# Patient Record
Sex: Male | Born: 1945 | Race: White | Hispanic: No | Smoking: Former smoker
Health system: Southern US, Community
[De-identification: ages and names within clinical notes are randomized; demographics above are authoritative.]

## PROBLEM LIST (undated history)

## (undated) DIAGNOSIS — Z8719 Personal history of other diseases of the digestive system: Secondary | ICD-10-CM

## (undated) DIAGNOSIS — M1990 Unspecified osteoarthritis, unspecified site: Secondary | ICD-10-CM

## (undated) DIAGNOSIS — I2510 Atherosclerotic heart disease of native coronary artery without angina pectoris: Secondary | ICD-10-CM

## (undated) DIAGNOSIS — R112 Nausea with vomiting, unspecified: Secondary | ICD-10-CM

## (undated) DIAGNOSIS — Z8711 Personal history of peptic ulcer disease: Secondary | ICD-10-CM

## (undated) DIAGNOSIS — K219 Gastro-esophageal reflux disease without esophagitis: Secondary | ICD-10-CM

## (undated) DIAGNOSIS — C801 Malignant (primary) neoplasm, unspecified: Secondary | ICD-10-CM

## (undated) DIAGNOSIS — I219 Acute myocardial infarction, unspecified: Secondary | ICD-10-CM

## (undated) DIAGNOSIS — I10 Essential (primary) hypertension: Secondary | ICD-10-CM

## (undated) DIAGNOSIS — C61 Malignant neoplasm of prostate: Secondary | ICD-10-CM

## (undated) DIAGNOSIS — G629 Polyneuropathy, unspecified: Secondary | ICD-10-CM

## (undated) DIAGNOSIS — Z9889 Other specified postprocedural states: Secondary | ICD-10-CM

## (undated) DIAGNOSIS — J189 Pneumonia, unspecified organism: Secondary | ICD-10-CM

## (undated) DIAGNOSIS — E7800 Pure hypercholesterolemia, unspecified: Secondary | ICD-10-CM

## (undated) DIAGNOSIS — R351 Nocturia: Secondary | ICD-10-CM

## (undated) HISTORY — PX: APPENDECTOMY: SHX54

## (undated) HISTORY — PX: KNEE ARTHROSCOPY: SUR90

## (undated) HISTORY — PX: EYE SURGERY: SHX253

---

## 2002-04-24 ENCOUNTER — Encounter (INDEPENDENT_AMBULATORY_CARE_PROVIDER_SITE_OTHER): Payer: Self-pay | Admitting: Specialist

## 2002-04-24 ENCOUNTER — Ambulatory Visit (HOSPITAL_COMMUNITY): Admission: RE | Admit: 2002-04-24 | Discharge: 2002-04-24 | Payer: Self-pay | Admitting: Gastroenterology

## 2010-12-10 ENCOUNTER — Inpatient Hospital Stay (HOSPITAL_COMMUNITY)
Admission: RE | Admit: 2010-12-10 | Discharge: 2010-12-20 | Payer: Self-pay | Source: Home / Self Care | Attending: Cardiothoracic Surgery | Admitting: Cardiothoracic Surgery

## 2010-12-10 DIAGNOSIS — I219 Acute myocardial infarction, unspecified: Secondary | ICD-10-CM

## 2010-12-10 HISTORY — DX: Acute myocardial infarction, unspecified: I21.9

## 2010-12-11 ENCOUNTER — Encounter: Payer: Self-pay | Admitting: Cardiothoracic Surgery

## 2010-12-13 HISTORY — PX: CORONARY ARTERY BYPASS GRAFT: SHX141

## 2010-12-23 ENCOUNTER — Emergency Department (HOSPITAL_COMMUNITY)
Admission: EM | Admit: 2010-12-23 | Discharge: 2010-12-23 | Payer: Self-pay | Source: Home / Self Care | Admitting: Emergency Medicine

## 2011-01-10 ENCOUNTER — Encounter
Admission: RE | Admit: 2011-01-10 | Discharge: 2011-01-10 | Payer: Self-pay | Source: Home / Self Care | Attending: Cardiothoracic Surgery | Admitting: Cardiothoracic Surgery

## 2011-01-17 ENCOUNTER — Ambulatory Visit
Admission: RE | Admit: 2011-01-17 | Discharge: 2011-01-17 | Payer: Self-pay | Source: Home / Self Care | Attending: Cardiothoracic Surgery | Admitting: Cardiothoracic Surgery

## 2011-01-31 ENCOUNTER — Ambulatory Visit: Payer: Medicare Other | Admitting: Cardiothoracic Surgery

## 2011-01-31 DIAGNOSIS — I2510 Atherosclerotic heart disease of native coronary artery without angina pectoris: Secondary | ICD-10-CM

## 2011-02-05 ENCOUNTER — Ambulatory Visit (HOSPITAL_COMMUNITY): Payer: No Typology Code available for payment source

## 2011-02-07 ENCOUNTER — Ambulatory Visit (HOSPITAL_COMMUNITY): Payer: No Typology Code available for payment source

## 2011-02-09 ENCOUNTER — Ambulatory Visit (HOSPITAL_COMMUNITY): Payer: No Typology Code available for payment source

## 2011-02-12 ENCOUNTER — Ambulatory Visit (HOSPITAL_COMMUNITY): Payer: No Typology Code available for payment source

## 2011-02-12 NOTE — Assessment & Plan Note (Signed)
OFFICE VISIT  [name], [name] DOB:  [birth year]                                        January 31, 2011 CHART #:  [id number]  CURRENT PROBLEMS: 1. Status post CABG x4 December 15 for severe multivessel disease with     STEMI. 2. Postoperative atrial fibrillation, converted to a sinus rhythm.  PRESENT ILLNESS:  [name] is a very nice 66 year old ex-smoker who presents for his second postop office visit after multivessel bypass grafting for unstable angina, STEMI and three-vessel disease.  I saw him 2 weeks ago, at which time, he is having considerable nausea, weakness and depression, which was inhibiting his recovery and rehabilitation. At that time, his amiodarone was stopped for his postoperative AFib and we also stopped Crestor, and he was provided with prescription for p.r.n. Zofran.  Subsequent to that visit, he was seen by a neurologist, Dr. Patsi Sears for some hematuria, and was found to have an apparent urethral abrasion from the catheter insertion at surgery and a CT scan was clean of any polyps, neoplasm or stone.  The hematuria has stopped. His nausea is improved.  He is eating much better, but he is not yet gained any weight back.  He is scheduled to start outpatient cardiac rehab next week.  He denies angina and the surgical incisions are all healing well.  CURRENT MEDICATIONS:  Synthroid, lisinopril 2.5, metformin 500 b.i.d., Lopressor 12.5 b.i.d., aspirin 81 daily, albuterol and Advil p.r.n., and nasal spray.  PHYSICAL EXAMINATION:  Vital Signs:  Blood pressure 100/70, pulse 65, respirations 20, saturation 95%.  He appears to be feeling much better at this time.  Breath sounds are clear and his cardiac rhythm is regular without gallop or murmur.  The surgical incisions well healed.  IMPRESSION AND PLAN:  The patient appears to be now back on track and he will stay off the amiodarone and Crestor and hopefully with improved appetite  and resolution of his nausea, his weight gain well resume.  I again reminded him not to lift anything more than 20 pounds until 3 months after surgery but to continue with the recommendations of his cardiac rehab program at the hospital, and that I would be happy to see him back p.r.n.  Kerin Perna, M.D. Electronically Signed  PV/MEDQ  D:  01/31/2011  T:  02/01/2011  Job:  [id number]  cc:   Georga Hacking, M.D.

## 2011-02-14 ENCOUNTER — Ambulatory Visit (HOSPITAL_COMMUNITY): Payer: No Typology Code available for payment source

## 2011-02-16 ENCOUNTER — Ambulatory Visit (HOSPITAL_COMMUNITY): Payer: No Typology Code available for payment source

## 2011-02-19 ENCOUNTER — Ambulatory Visit (HOSPITAL_COMMUNITY): Payer: No Typology Code available for payment source

## 2011-02-21 ENCOUNTER — Ambulatory Visit (HOSPITAL_COMMUNITY): Payer: No Typology Code available for payment source

## 2011-02-23 ENCOUNTER — Ambulatory Visit (HOSPITAL_COMMUNITY): Payer: No Typology Code available for payment source

## 2011-02-26 ENCOUNTER — Ambulatory Visit (HOSPITAL_COMMUNITY): Payer: No Typology Code available for payment source

## 2011-02-28 ENCOUNTER — Ambulatory Visit (HOSPITAL_COMMUNITY): Payer: No Typology Code available for payment source

## 2011-03-02 ENCOUNTER — Ambulatory Visit (HOSPITAL_COMMUNITY): Payer: No Typology Code available for payment source

## 2011-03-05 ENCOUNTER — Ambulatory Visit (HOSPITAL_COMMUNITY): Payer: No Typology Code available for payment source

## 2011-03-07 ENCOUNTER — Ambulatory Visit (HOSPITAL_COMMUNITY): Payer: No Typology Code available for payment source

## 2011-03-09 ENCOUNTER — Ambulatory Visit (HOSPITAL_COMMUNITY): Payer: No Typology Code available for payment source

## 2011-03-12 ENCOUNTER — Ambulatory Visit (HOSPITAL_COMMUNITY): Payer: No Typology Code available for payment source

## 2011-03-12 LAB — APTT: aPTT: 33 seconds (ref 24–37)

## 2011-03-12 LAB — CBC
HCT: 26.5 % — ABNORMAL LOW (ref 39.0–52.0)
HCT: 28.4 % — ABNORMAL LOW (ref 39.0–52.0)
HCT: 28.5 % — ABNORMAL LOW (ref 39.0–52.0)
HCT: 29.8 % — ABNORMAL LOW (ref 39.0–52.0)
HCT: 31 % — ABNORMAL LOW (ref 39.0–52.0)
HCT: 31.6 % — ABNORMAL LOW (ref 39.0–52.0)
HCT: 32.7 % — ABNORMAL LOW (ref 39.0–52.0)
HCT: 33.8 % — ABNORMAL LOW (ref 39.0–52.0)
Hemoglobin: 10 g/dL — ABNORMAL LOW (ref 13.0–17.0)
Hemoglobin: 10.2 g/dL — ABNORMAL LOW (ref 13.0–17.0)
Hemoglobin: 10.6 g/dL — ABNORMAL LOW (ref 13.0–17.0)
Hemoglobin: 11 g/dL — ABNORMAL LOW (ref 13.0–17.0)
Hemoglobin: 11.4 g/dL — ABNORMAL LOW (ref 13.0–17.0)
Hemoglobin: 11.9 g/dL — ABNORMAL LOW (ref 13.0–17.0)
Hemoglobin: 9.2 g/dL — ABNORMAL LOW (ref 13.0–17.0)
MCH: 31.4 pg (ref 26.0–34.0)
MCH: 31.5 pg (ref 26.0–34.0)
MCH: 31.6 pg (ref 26.0–34.0)
MCH: 31.6 pg (ref 26.0–34.0)
MCH: 31.7 pg (ref 26.0–34.0)
MCH: 31.8 pg (ref 26.0–34.0)
MCH: 31.9 pg (ref 26.0–34.0)
MCH: 32.1 pg (ref 26.0–34.0)
MCH: 32.6 pg (ref 26.0–34.0)
MCHC: 34.7 g/dL (ref 30.0–36.0)
MCHC: 34.7 g/dL (ref 30.0–36.0)
MCHC: 35.2 g/dL (ref 30.0–36.0)
MCHC: 35.5 g/dL (ref 30.0–36.0)
MCHC: 35.5 g/dL (ref 30.0–36.0)
MCHC: 35.6 g/dL (ref 30.0–36.0)
MCHC: 35.8 g/dL (ref 30.0–36.0)
MCHC: 36.1 g/dL — ABNORMAL HIGH (ref 30.0–36.0)
MCHC: 36.4 g/dL — ABNORMAL HIGH (ref 30.0–36.0)
MCV: 88 fL (ref 78.0–100.0)
MCV: 88.1 fL (ref 78.0–100.0)
MCV: 88.6 fL (ref 78.0–100.0)
MCV: 88.9 fL (ref 78.0–100.0)
MCV: 89.8 fL (ref 78.0–100.0)
MCV: 90.2 fL (ref 78.0–100.0)
MCV: 90.6 fL (ref 78.0–100.0)
MCV: 91.1 fL (ref 78.0–100.0)
MCV: 91.1 fL (ref 78.0–100.0)
Platelets: 177 10*3/uL (ref 150–400)
Platelets: 184 10*3/uL (ref 150–400)
Platelets: 198 10*3/uL (ref 150–400)
Platelets: 203 10*3/uL (ref 150–400)
Platelets: 213 10*3/uL (ref 150–400)
Platelets: 218 10*3/uL (ref 150–400)
Platelets: 257 10*3/uL (ref 150–400)
Platelets: 445 10*3/uL — ABNORMAL HIGH (ref 150–400)
RBC: 2.91 MIL/uL — ABNORMAL LOW (ref 4.22–5.81)
RBC: 3.13 MIL/uL — ABNORMAL LOW (ref 4.22–5.81)
RBC: 3.15 MIL/uL — ABNORMAL LOW (ref 4.22–5.81)
RBC: 3.32 MIL/uL — ABNORMAL LOW (ref 4.22–5.81)
RBC: 3.5 MIL/uL — ABNORMAL LOW (ref 4.22–5.81)
RBC: 3.59 MIL/uL — ABNORMAL LOW (ref 4.22–5.81)
RBC: 3.71 MIL/uL — ABNORMAL LOW (ref 4.22–5.81)
RDW: 11.9 % (ref 11.5–15.5)
RDW: 12 % (ref 11.5–15.5)
RDW: 12.2 % (ref 11.5–15.5)
RDW: 12.3 % (ref 11.5–15.5)
RDW: 12.3 % (ref 11.5–15.5)
RDW: 12.3 % (ref 11.5–15.5)
RDW: 12.4 % (ref 11.5–15.5)
RDW: 12.6 % (ref 11.5–15.5)
RDW: 12.7 % (ref 11.5–15.5)
WBC: 11.2 10*3/uL — ABNORMAL HIGH (ref 4.0–10.5)
WBC: 11.5 10*3/uL — ABNORMAL HIGH (ref 4.0–10.5)
WBC: 11.7 10*3/uL — ABNORMAL HIGH (ref 4.0–10.5)
WBC: 11.9 10*3/uL — ABNORMAL HIGH (ref 4.0–10.5)
WBC: 13.1 10*3/uL — ABNORMAL HIGH (ref 4.0–10.5)
WBC: 13.7 10*3/uL — ABNORMAL HIGH (ref 4.0–10.5)
WBC: 13.9 10*3/uL — ABNORMAL HIGH (ref 4.0–10.5)
WBC: 14.5 10*3/uL — ABNORMAL HIGH (ref 4.0–10.5)

## 2011-03-12 LAB — GLUCOSE, CAPILLARY
Glucose-Capillary: 107 mg/dL — ABNORMAL HIGH (ref 70–99)
Glucose-Capillary: 111 mg/dL — ABNORMAL HIGH (ref 70–99)
Glucose-Capillary: 112 mg/dL — ABNORMAL HIGH (ref 70–99)
Glucose-Capillary: 121 mg/dL — ABNORMAL HIGH (ref 70–99)
Glucose-Capillary: 129 mg/dL — ABNORMAL HIGH (ref 70–99)
Glucose-Capillary: 135 mg/dL — ABNORMAL HIGH (ref 70–99)
Glucose-Capillary: 140 mg/dL — ABNORMAL HIGH (ref 70–99)
Glucose-Capillary: 143 mg/dL — ABNORMAL HIGH (ref 70–99)
Glucose-Capillary: 144 mg/dL — ABNORMAL HIGH (ref 70–99)
Glucose-Capillary: 153 mg/dL — ABNORMAL HIGH (ref 70–99)
Glucose-Capillary: 158 mg/dL — ABNORMAL HIGH (ref 70–99)
Glucose-Capillary: 173 mg/dL — ABNORMAL HIGH (ref 70–99)
Glucose-Capillary: 213 mg/dL — ABNORMAL HIGH (ref 70–99)
Glucose-Capillary: 217 mg/dL — ABNORMAL HIGH (ref 70–99)
Glucose-Capillary: 96 mg/dL (ref 70–99)

## 2011-03-12 LAB — CREATININE, SERUM
Creatinine, Ser: 0.8 mg/dL (ref 0.4–1.5)
Creatinine, Ser: 1.02 mg/dL (ref 0.4–1.5)
GFR calc Af Amer: >60 mL/min (ref >60)
GFR calc Af Amer: >60 mL/min (ref >60)
GFR calc non Af Amer: >60 mL/min (ref >60)
GFR calc non Af Amer: >60 mL/min (ref >60)

## 2011-03-12 LAB — BASIC METABOLIC PANEL
BUN: 10 mg/dL (ref 6–23)
BUN: 16 mg/dL (ref 6–23)
BUN: 17 mg/dL (ref 6–23)
BUN: 18 mg/dL (ref 6–23)
BUN: 21 mg/dL (ref 6–23)
CO2: 25 mEq/L (ref 19–32)
CO2: 28 mEq/L (ref 19–32)
CO2: 29 mEq/L (ref 19–32)
CO2: 29 mEq/L (ref 19–32)
CO2: 31 mEq/L (ref 19–32)
Calcium: 7.5 mg/dL — ABNORMAL LOW (ref 8.4–10.5)
Calcium: 7.8 mg/dL — ABNORMAL LOW (ref 8.4–10.5)
Calcium: 7.9 mg/dL — ABNORMAL LOW (ref 8.4–10.5)
Calcium: 7.9 mg/dL — ABNORMAL LOW (ref 8.4–10.5)
Calcium: 8.5 mg/dL (ref 8.4–10.5)
Chloride: 104 mEq/L (ref 96–112)
Chloride: 93 mEq/L — ABNORMAL LOW (ref 96–112)
Chloride: 94 mEq/L — ABNORMAL LOW (ref 96–112)
Chloride: 95 mEq/L — ABNORMAL LOW (ref 96–112)
Chloride: 95 mEq/L — ABNORMAL LOW (ref 96–112)
Creatinine, Ser: 0.95 mg/dL (ref 0.4–1.5)
Creatinine, Ser: 1 mg/dL (ref 0.4–1.5)
Creatinine, Ser: 1 mg/dL (ref 0.4–1.5)
Creatinine, Ser: 1.03 mg/dL (ref 0.4–1.5)
Creatinine, Ser: 1.06 mg/dL (ref 0.4–1.5)
GFR calc Af Amer: >60 mL/min (ref >60)
GFR calc Af Amer: >60 mL/min (ref >60)
GFR calc Af Amer: >60 mL/min (ref >60)
GFR calc Af Amer: >60 mL/min (ref >60)
GFR calc non Af Amer: >60 mL/min (ref >60)
GFR calc non Af Amer: >60 mL/min (ref >60)
GFR calc non Af Amer: >60 mL/min (ref >60)
GFR calc non Af Amer: >60 mL/min (ref >60)
Glucose, Bld: 125 mg/dL — ABNORMAL HIGH (ref 70–99)
Glucose, Bld: 150 mg/dL — ABNORMAL HIGH (ref 70–99)
Glucose, Bld: 151 mg/dL — ABNORMAL HIGH (ref 70–99)
Glucose, Bld: 166 mg/dL — ABNORMAL HIGH (ref 70–99)
Potassium: 3.7 mEq/L (ref 3.5–5.1)
Potassium: 4 mEq/L (ref 3.5–5.1)
Potassium: 4 mEq/L (ref 3.5–5.1)
Potassium: 4.3 mEq/L (ref 3.5–5.1)
Sodium: 128 mEq/L — ABNORMAL LOW (ref 135–145)
Sodium: 129 mEq/L — ABNORMAL LOW (ref 135–145)
Sodium: 132 mEq/L — ABNORMAL LOW (ref 135–145)
Sodium: 134 mEq/L — ABNORMAL LOW (ref 135–145)

## 2011-03-12 LAB — POCT I-STAT 3, VENOUS BLOOD GAS (G3P V)
Bicarbonate: 24.7 mEq/L — ABNORMAL HIGH (ref 20.0–24.0)
O2 Saturation: 82 %
TCO2: 26 mmol/L (ref 0–100)
pCO2, Ven: 29.4 mmHg — ABNORMAL LOW (ref 45.0–50.0)

## 2011-03-12 LAB — HEPATIC FUNCTION PANEL
ALT: 31 U/L (ref 0–53)
AST: 28 U/L (ref 0–37)
Albumin: 2.7 g/dL — ABNORMAL LOW (ref 3.5–5.2)
Alkaline Phosphatase: 60 U/L (ref 39–117)
Bilirubin, Direct: 0.4 mg/dL — ABNORMAL HIGH (ref 0.0–0.3)
Indirect Bilirubin: 0.6 mg/dL (ref 0.3–0.9)
Total Bilirubin: 1 mg/dL (ref 0.3–1.2)
Total Protein: 5.7 g/dL — ABNORMAL LOW (ref 6.0–8.3)

## 2011-03-12 LAB — POCT I-STAT 3, ART BLOOD GAS (G3+)
Acid-Base Excess: 1 mmol/L (ref 0.0–2.0)
Acid-Base Excess: 3 mmol/L — ABNORMAL HIGH (ref 0.0–2.0)
Acid-base deficit: 1 mmol/L (ref 0.0–2.0)
Bicarbonate: 24 mEq/L (ref 20.0–24.0)
Bicarbonate: 25.2 mEq/L — ABNORMAL HIGH (ref 20.0–24.0)
Bicarbonate: 25.4 mEq/L — ABNORMAL HIGH (ref 20.0–24.0)
O2 Saturation: 100 %
O2 Saturation: 99 %
Patient temperature: 32.4
Patient temperature: 35.7
Patient temperature: 36.7
Patient temperature: 37
TCO2: 26 mmol/L (ref 0–100)
TCO2: 26 mmol/L (ref 0–100)
pH, Arterial: 7.364 (ref 7.350–7.450)
pO2, Arterial: 128 mmHg — ABNORMAL HIGH (ref 80.0–100.0)
pO2, Arterial: 220 mmHg — ABNORMAL HIGH (ref 80.0–100.0)
pO2, Arterial: 236 mmHg — ABNORMAL HIGH (ref 80.0–100.0)
pO2, Arterial: 85 mmHg (ref 80.0–100.0)

## 2011-03-12 LAB — POCT I-STAT 4, (NA,K, GLUC, HGB,HCT)
Glucose, Bld: 131 mg/dL — ABNORMAL HIGH (ref 70–99)
Glucose, Bld: 145 mg/dL — ABNORMAL HIGH (ref 70–99)
HCT: 33 % — ABNORMAL LOW (ref 39.0–52.0)
Hemoglobin: 10.2 g/dL — ABNORMAL LOW (ref 13.0–17.0)
Hemoglobin: 12.9 g/dL — ABNORMAL LOW (ref 13.0–17.0)
Hemoglobin: 8.8 g/dL — ABNORMAL LOW (ref 13.0–17.0)
Potassium: 3.6 mEq/L (ref 3.5–5.1)
Potassium: 4 mEq/L (ref 3.5–5.1)
Potassium: 4.3 mEq/L (ref 3.5–5.1)
Potassium: 4.7 mEq/L (ref 3.5–5.1)
Sodium: 131 mEq/L — ABNORMAL LOW (ref 135–145)
Sodium: 137 mEq/L (ref 135–145)

## 2011-03-12 LAB — PROTIME-INR
INR: 1.26 (ref 0.00–1.49)
Prothrombin Time: 16 seconds — ABNORMAL HIGH (ref 11.6–15.2)

## 2011-03-12 LAB — MAGNESIUM
Magnesium: 2.6 mg/dL — ABNORMAL HIGH (ref 1.5–2.5)
Magnesium: 2.7 mg/dL — ABNORMAL HIGH (ref 1.5–2.5)
Magnesium: 2.8 mg/dL — ABNORMAL HIGH (ref 1.5–2.5)
Magnesium: 3.2 mg/dL — ABNORMAL HIGH (ref 1.5–2.5)

## 2011-03-12 LAB — COMPREHENSIVE METABOLIC PANEL
Alkaline Phosphatase: 104 U/L (ref 39–117)
BUN: 15 mg/dL (ref 6–23)
Creatinine, Ser: 1.18 mg/dL (ref 0.4–1.5)
Glucose, Bld: 127 mg/dL — ABNORMAL HIGH (ref 70–99)
Potassium: 4.3 mEq/L (ref 3.5–5.1)
Total Protein: 6.6 g/dL (ref 6.0–8.3)

## 2011-03-12 LAB — DIFFERENTIAL
Basophils Absolute: 0 10*3/uL (ref 0.0–0.1)
Basophils Relative: 0 % (ref 0–1)
Monocytes Relative: 10 % (ref 3–12)
Neutro Abs: 10.8 10*3/uL — ABNORMAL HIGH (ref 1.7–7.7)
Neutrophils Relative %: 79 % — ABNORMAL HIGH (ref 43–77)

## 2011-03-12 LAB — HEMOGLOBIN AND HEMATOCRIT, BLOOD
HCT: 24.6 % — ABNORMAL LOW (ref 39.0–52.0)
Hemoglobin: 9 g/dL — ABNORMAL LOW (ref 13.0–17.0)

## 2011-03-12 LAB — URINALYSIS, ROUTINE W REFLEX MICROSCOPIC
Glucose, UA: NEGATIVE mg/dL
Leukocytes, UA: NEGATIVE
Protein, ur: NEGATIVE mg/dL
Urobilinogen, UA: 2 mg/dL — ABNORMAL HIGH (ref 0.0–1.0)

## 2011-03-12 LAB — AMYLASE: Amylase: 37 U/L (ref 0–105)

## 2011-03-12 LAB — POCT I-STAT, CHEM 8
Calcium, Ion: 1.08 mmol/L — ABNORMAL LOW (ref 1.12–1.32)
Creatinine, Ser: 1 mg/dL (ref 0.4–1.5)
Glucose, Bld: 180 mg/dL — ABNORMAL HIGH (ref 70–99)
Hemoglobin: 10.5 g/dL — ABNORMAL LOW (ref 13.0–17.0)
Hemoglobin: 12.6 g/dL — ABNORMAL LOW (ref 13.0–17.0)
Sodium: 137 mEq/L (ref 135–145)
TCO2: 22 mmol/L (ref 0–100)
TCO2: 28 mmol/L (ref 0–100)

## 2011-03-12 LAB — POCT I-STAT GLUCOSE
Glucose, Bld: 148 mg/dL — ABNORMAL HIGH (ref 70–99)
Operator id: 3390

## 2011-03-12 LAB — PLATELET COUNT: Platelets: 148 10*3/uL — ABNORMAL LOW (ref 150–400)

## 2011-03-12 LAB — URINE CULTURE

## 2011-03-12 LAB — TSH: TSH: 8.34 u[IU]/mL — ABNORMAL HIGH (ref 0.350–4.500)

## 2011-03-12 LAB — LIPASE, BLOOD: Lipase: 34 U/L (ref 11–59)

## 2011-03-12 LAB — URINE MICROSCOPIC-ADD ON

## 2011-03-13 LAB — CARDIAC PANEL(CRET KIN+CKTOT+MB+TROPI)
CK, MB: 19.2 ng/mL (ref 0.3–4.0)
CK, MB: 41.1 ng/mL (ref 0.3–4.0)
Relative Index: 3.9 — ABNORMAL HIGH (ref 0.0–2.5)
Total CK: 857 U/L — ABNORMAL HIGH (ref 7–232)
Troponin I: 1.44 ng/mL (ref 0.00–0.06)
Troponin I: 25.93 ng/mL (ref 0.00–0.06)
Troponin I: 31.22 ng/mL (ref 0.00–0.06)

## 2011-03-13 LAB — CBC
HCT: 38 % — ABNORMAL LOW (ref 39.0–52.0)
HCT: 39.8 % (ref 39.0–52.0)
Hemoglobin: 13.6 g/dL (ref 13.0–17.0)
Hemoglobin: 14 g/dL (ref 13.0–17.0)
MCH: 31.8 pg (ref 26.0–34.0)
MCH: 32 pg (ref 26.0–34.0)
MCHC: 35.2 g/dL (ref 30.0–36.0)
MCHC: 35.3 g/dL (ref 30.0–36.0)
MCHC: 35.8 g/dL (ref 30.0–36.0)
MCV: 89.4 fL (ref 78.0–100.0)
MCV: 89.6 fL (ref 78.0–100.0)
MCV: 90.2 fL (ref 78.0–100.0)
Platelets: 188 10*3/uL (ref 150–400)
Platelets: 199 10*3/uL (ref 150–400)
RBC: 4.25 MIL/uL (ref 4.22–5.81)
RDW: 11.9 % (ref 11.5–15.5)
RDW: 11.9 % (ref 11.5–15.5)
RDW: 12 % (ref 11.5–15.5)
RDW: 12 % (ref 11.5–15.5)
WBC: 10.4 10*3/uL (ref 4.0–10.5)
WBC: 6.9 10*3/uL (ref 4.0–10.5)

## 2011-03-13 LAB — APTT
aPTT: 126 seconds — ABNORMAL HIGH (ref 24–37)
aPTT: 40 seconds — ABNORMAL HIGH (ref 24–37)

## 2011-03-13 LAB — POCT I-STAT, CHEM 8
Chloride: 94 mEq/L — ABNORMAL LOW (ref 96–112)
HCT: 40 % (ref 39.0–52.0)
Potassium: 3.2 mEq/L — ABNORMAL LOW (ref 3.5–5.1)
Sodium: 129 mEq/L — ABNORMAL LOW (ref 135–145)

## 2011-03-13 LAB — BLOOD GAS, ARTERIAL
Acid-Base Excess: 5.1 mmol/L — ABNORMAL HIGH (ref 0.0–2.0)
Bicarbonate: 28.6 mEq/L — ABNORMAL HIGH (ref 20.0–24.0)
Drawn by: 270221
O2 Saturation: 96.7 %
Patient temperature: 98.6
TCO2: 29.8 mmol/L (ref 0–100)
pCO2 arterial: 38.5 mmHg (ref 35.0–45.0)
pH, Arterial: 7.485 — ABNORMAL HIGH (ref 7.350–7.450)
pO2, Arterial: 79.7 mmHg — ABNORMAL LOW (ref 80.0–100.0)

## 2011-03-13 LAB — PREPARE PLATELETS: Unit division: 0

## 2011-03-13 LAB — BASIC METABOLIC PANEL
BUN: 12 mg/dL (ref 6–23)
BUN: 13 mg/dL (ref 6–23)
CO2: 29 mEq/L (ref 19–32)
Calcium: 8.4 mg/dL (ref 8.4–10.5)
Chloride: 96 mEq/L (ref 96–112)
Chloride: 98 mEq/L (ref 96–112)
Creatinine, Ser: 1.04 mg/dL (ref 0.4–1.5)
Creatinine, Ser: 1.14 mg/dL (ref 0.4–1.5)
GFR calc Af Amer: >60 mL/min (ref >60)
GFR calc non Af Amer: >60 mL/min (ref >60)
GFR calc non Af Amer: >60 mL/min (ref >60)
Glucose, Bld: 145 mg/dL — ABNORMAL HIGH (ref 70–99)
Potassium: 3.7 mEq/L (ref 3.5–5.1)
Sodium: 132 mEq/L — ABNORMAL LOW (ref 135–145)

## 2011-03-13 LAB — URINALYSIS, ROUTINE W REFLEX MICROSCOPIC
Bilirubin Urine: NEGATIVE
Glucose, UA: 500 mg/dL — AB
Hgb urine dipstick: NEGATIVE
Ketones, ur: NEGATIVE mg/dL
Nitrite: NEGATIVE
Protein, ur: NEGATIVE mg/dL
Specific Gravity, Urine: 1.021 (ref 1.005–1.030)
Urobilinogen, UA: 1 mg/dL (ref 0.0–1.0)
pH: 6.5 (ref 5.0–8.0)

## 2011-03-13 LAB — CROSSMATCH
ABO/RH(D): O NEG
Antibody Screen: NEGATIVE
Unit division: 0
Unit division: 0

## 2011-03-13 LAB — PROTIME-INR
INR: 1.08 (ref 0.00–1.49)
INR: 8.51 (ref 0.00–1.49)
Prothrombin Time: 14.2 seconds (ref 11.6–15.2)
Prothrombin Time: 69.6 seconds — ABNORMAL HIGH (ref 11.6–15.2)

## 2011-03-13 LAB — MRSA PCR SCREENING: MRSA by PCR: NEGATIVE

## 2011-03-13 LAB — LIPID PANEL: Cholesterol: 166 mg/dL (ref 0–200)

## 2011-03-13 LAB — HEMOGLOBIN A1C
Hgb A1c MFr Bld: 10.2 % — ABNORMAL HIGH (ref <5.7)
Mean Plasma Glucose: 246 mg/dL — ABNORMAL HIGH (ref <117)

## 2011-03-13 LAB — COMPREHENSIVE METABOLIC PANEL
ALT: 31 U/L (ref 0–53)
Calcium: 8.4 mg/dL (ref 8.4–10.5)
Creatinine, Ser: 1.2 mg/dL (ref 0.4–1.5)
GFR calc Af Amer: >60 mL/min (ref >60)
Glucose, Bld: 230 mg/dL — ABNORMAL HIGH (ref 70–99)
Sodium: 134 mEq/L — ABNORMAL LOW (ref 135–145)
Total Protein: 6 g/dL (ref 6.0–8.3)

## 2011-03-13 LAB — GLUCOSE, CAPILLARY
Glucose-Capillary: 139 mg/dL — ABNORMAL HIGH (ref 70–99)
Glucose-Capillary: 160 mg/dL — ABNORMAL HIGH (ref 70–99)
Glucose-Capillary: 177 mg/dL — ABNORMAL HIGH (ref 70–99)
Glucose-Capillary: 203 mg/dL — ABNORMAL HIGH (ref 70–99)
Glucose-Capillary: 223 mg/dL — ABNORMAL HIGH (ref 70–99)

## 2011-03-13 LAB — TSH: TSH: 6.697 u[IU]/mL — ABNORMAL HIGH (ref 0.350–4.500)

## 2011-03-13 LAB — ABO/RH: ABO/RH(D): O NEG

## 2011-03-14 ENCOUNTER — Ambulatory Visit (HOSPITAL_COMMUNITY): Payer: No Typology Code available for payment source

## 2011-03-16 ENCOUNTER — Ambulatory Visit (HOSPITAL_COMMUNITY): Payer: No Typology Code available for payment source

## 2011-03-19 ENCOUNTER — Ambulatory Visit (HOSPITAL_COMMUNITY): Payer: No Typology Code available for payment source

## 2011-03-21 ENCOUNTER — Ambulatory Visit (HOSPITAL_COMMUNITY): Payer: No Typology Code available for payment source

## 2011-03-23 ENCOUNTER — Ambulatory Visit (HOSPITAL_COMMUNITY): Payer: No Typology Code available for payment source

## 2011-03-26 ENCOUNTER — Ambulatory Visit (HOSPITAL_COMMUNITY): Payer: No Typology Code available for payment source

## 2011-03-28 ENCOUNTER — Ambulatory Visit (HOSPITAL_COMMUNITY): Payer: No Typology Code available for payment source

## 2011-03-30 ENCOUNTER — Ambulatory Visit (HOSPITAL_COMMUNITY): Payer: No Typology Code available for payment source

## 2011-04-02 ENCOUNTER — Ambulatory Visit (HOSPITAL_COMMUNITY): Payer: No Typology Code available for payment source

## 2011-04-04 ENCOUNTER — Ambulatory Visit (HOSPITAL_COMMUNITY): Payer: No Typology Code available for payment source

## 2011-04-06 ENCOUNTER — Ambulatory Visit (HOSPITAL_COMMUNITY): Payer: No Typology Code available for payment source

## 2011-04-09 ENCOUNTER — Ambulatory Visit (HOSPITAL_COMMUNITY): Payer: No Typology Code available for payment source

## 2011-04-11 ENCOUNTER — Ambulatory Visit (HOSPITAL_COMMUNITY): Payer: No Typology Code available for payment source

## 2011-04-13 ENCOUNTER — Ambulatory Visit (HOSPITAL_COMMUNITY): Payer: No Typology Code available for payment source

## 2011-04-16 ENCOUNTER — Ambulatory Visit (HOSPITAL_COMMUNITY): Payer: No Typology Code available for payment source

## 2011-04-18 ENCOUNTER — Ambulatory Visit (HOSPITAL_COMMUNITY): Payer: No Typology Code available for payment source

## 2011-04-20 ENCOUNTER — Ambulatory Visit (HOSPITAL_COMMUNITY): Payer: No Typology Code available for payment source

## 2011-04-23 ENCOUNTER — Ambulatory Visit (HOSPITAL_COMMUNITY): Payer: No Typology Code available for payment source

## 2011-04-25 ENCOUNTER — Ambulatory Visit (HOSPITAL_COMMUNITY): Payer: No Typology Code available for payment source

## 2011-04-27 ENCOUNTER — Ambulatory Visit (HOSPITAL_COMMUNITY): Payer: No Typology Code available for payment source

## 2011-04-30 ENCOUNTER — Ambulatory Visit (HOSPITAL_COMMUNITY): Payer: No Typology Code available for payment source

## 2011-05-02 ENCOUNTER — Ambulatory Visit (HOSPITAL_COMMUNITY): Payer: No Typology Code available for payment source

## 2011-05-04 ENCOUNTER — Ambulatory Visit (HOSPITAL_COMMUNITY): Payer: No Typology Code available for payment source

## 2011-05-07 ENCOUNTER — Ambulatory Visit (HOSPITAL_COMMUNITY): Payer: No Typology Code available for payment source

## 2011-05-09 ENCOUNTER — Ambulatory Visit (HOSPITAL_COMMUNITY): Payer: No Typology Code available for payment source

## 2011-05-11 ENCOUNTER — Ambulatory Visit (HOSPITAL_COMMUNITY): Payer: No Typology Code available for payment source

## 2011-05-13 IMAGING — CR DG CHEST 2V
2 series · 2 of 2 positions shown · non-contrast
Comparison: 12/18/2010

CLINICAL DATA: CABG, shortness of breath.

CHEST - 2 VIEW

[w chest pa]
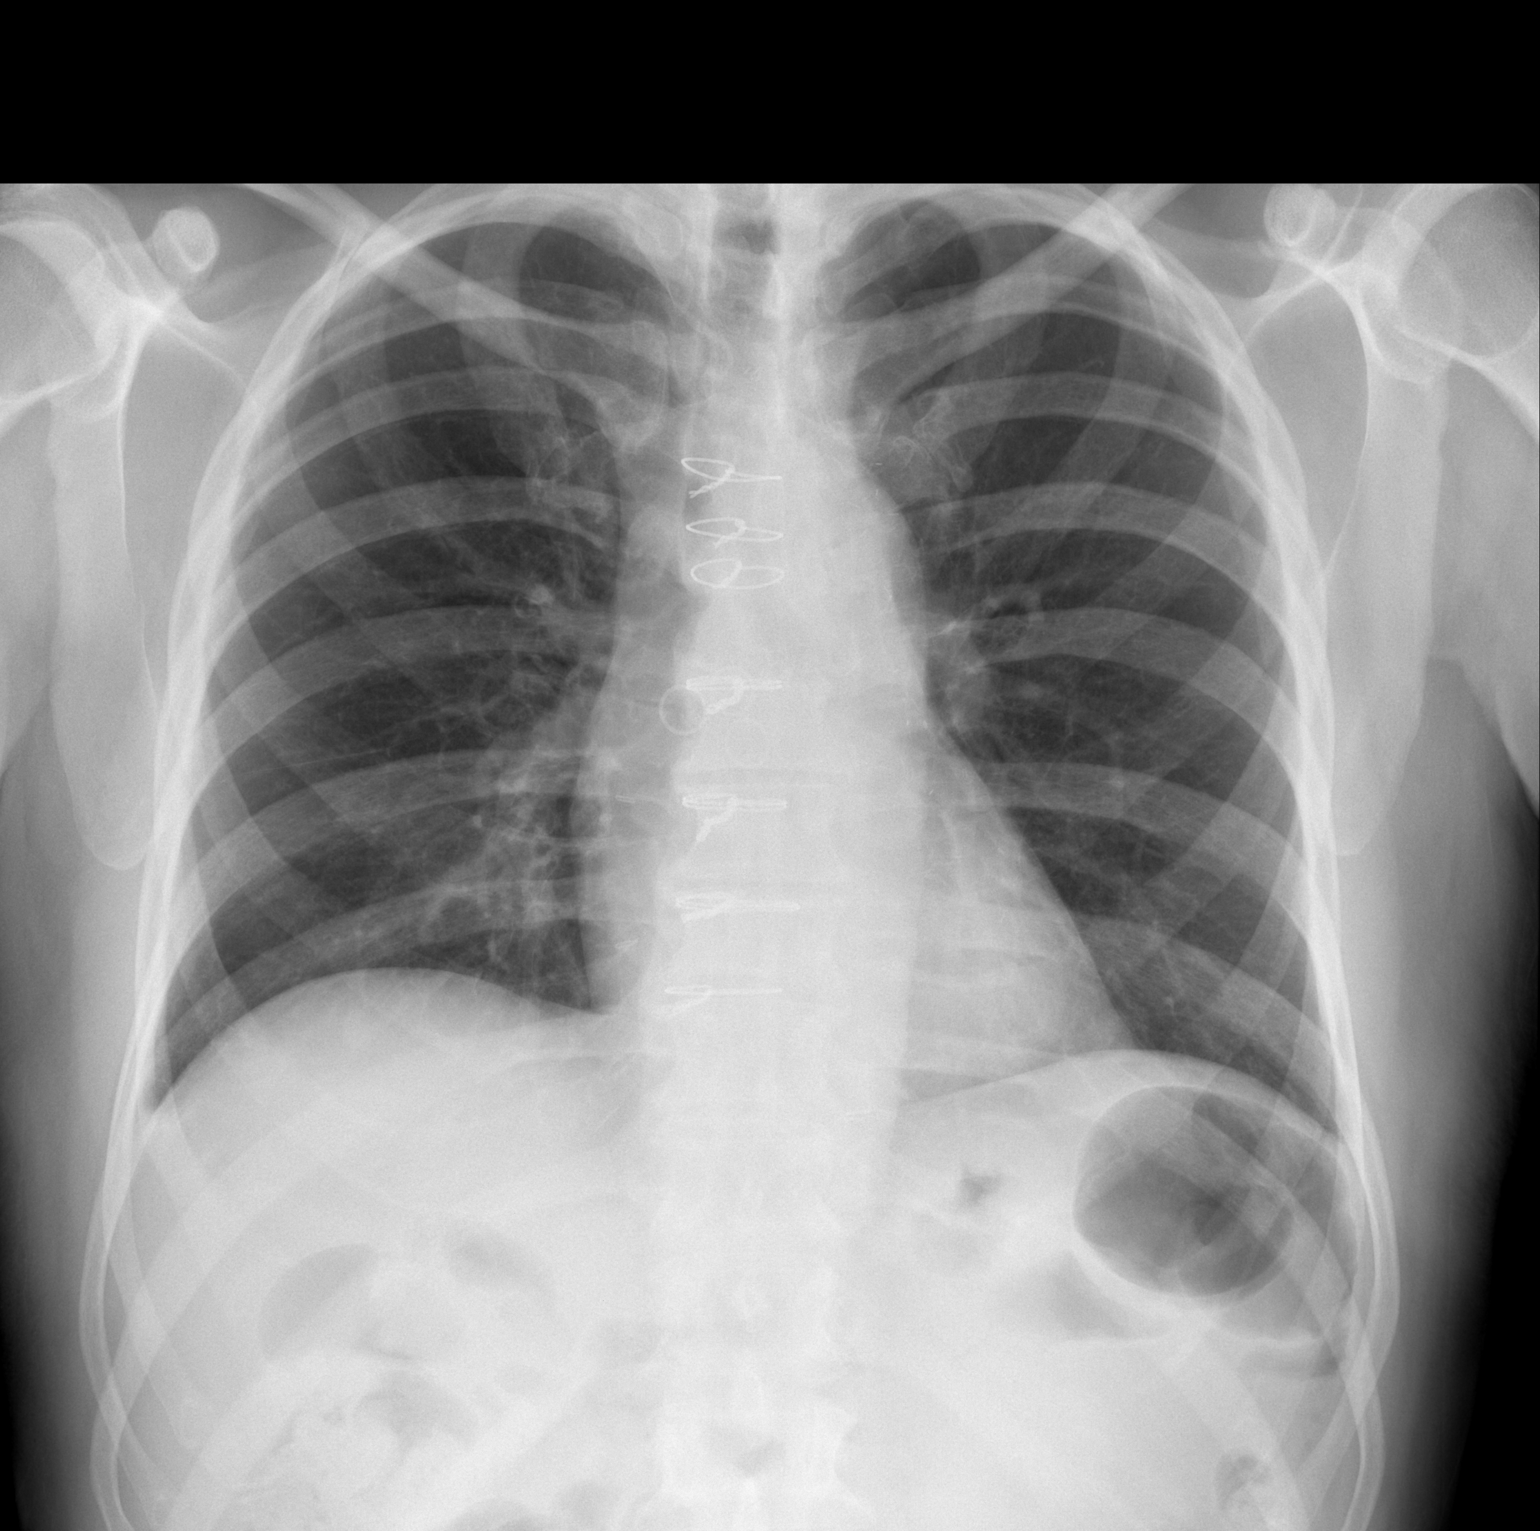

[w chest lat]
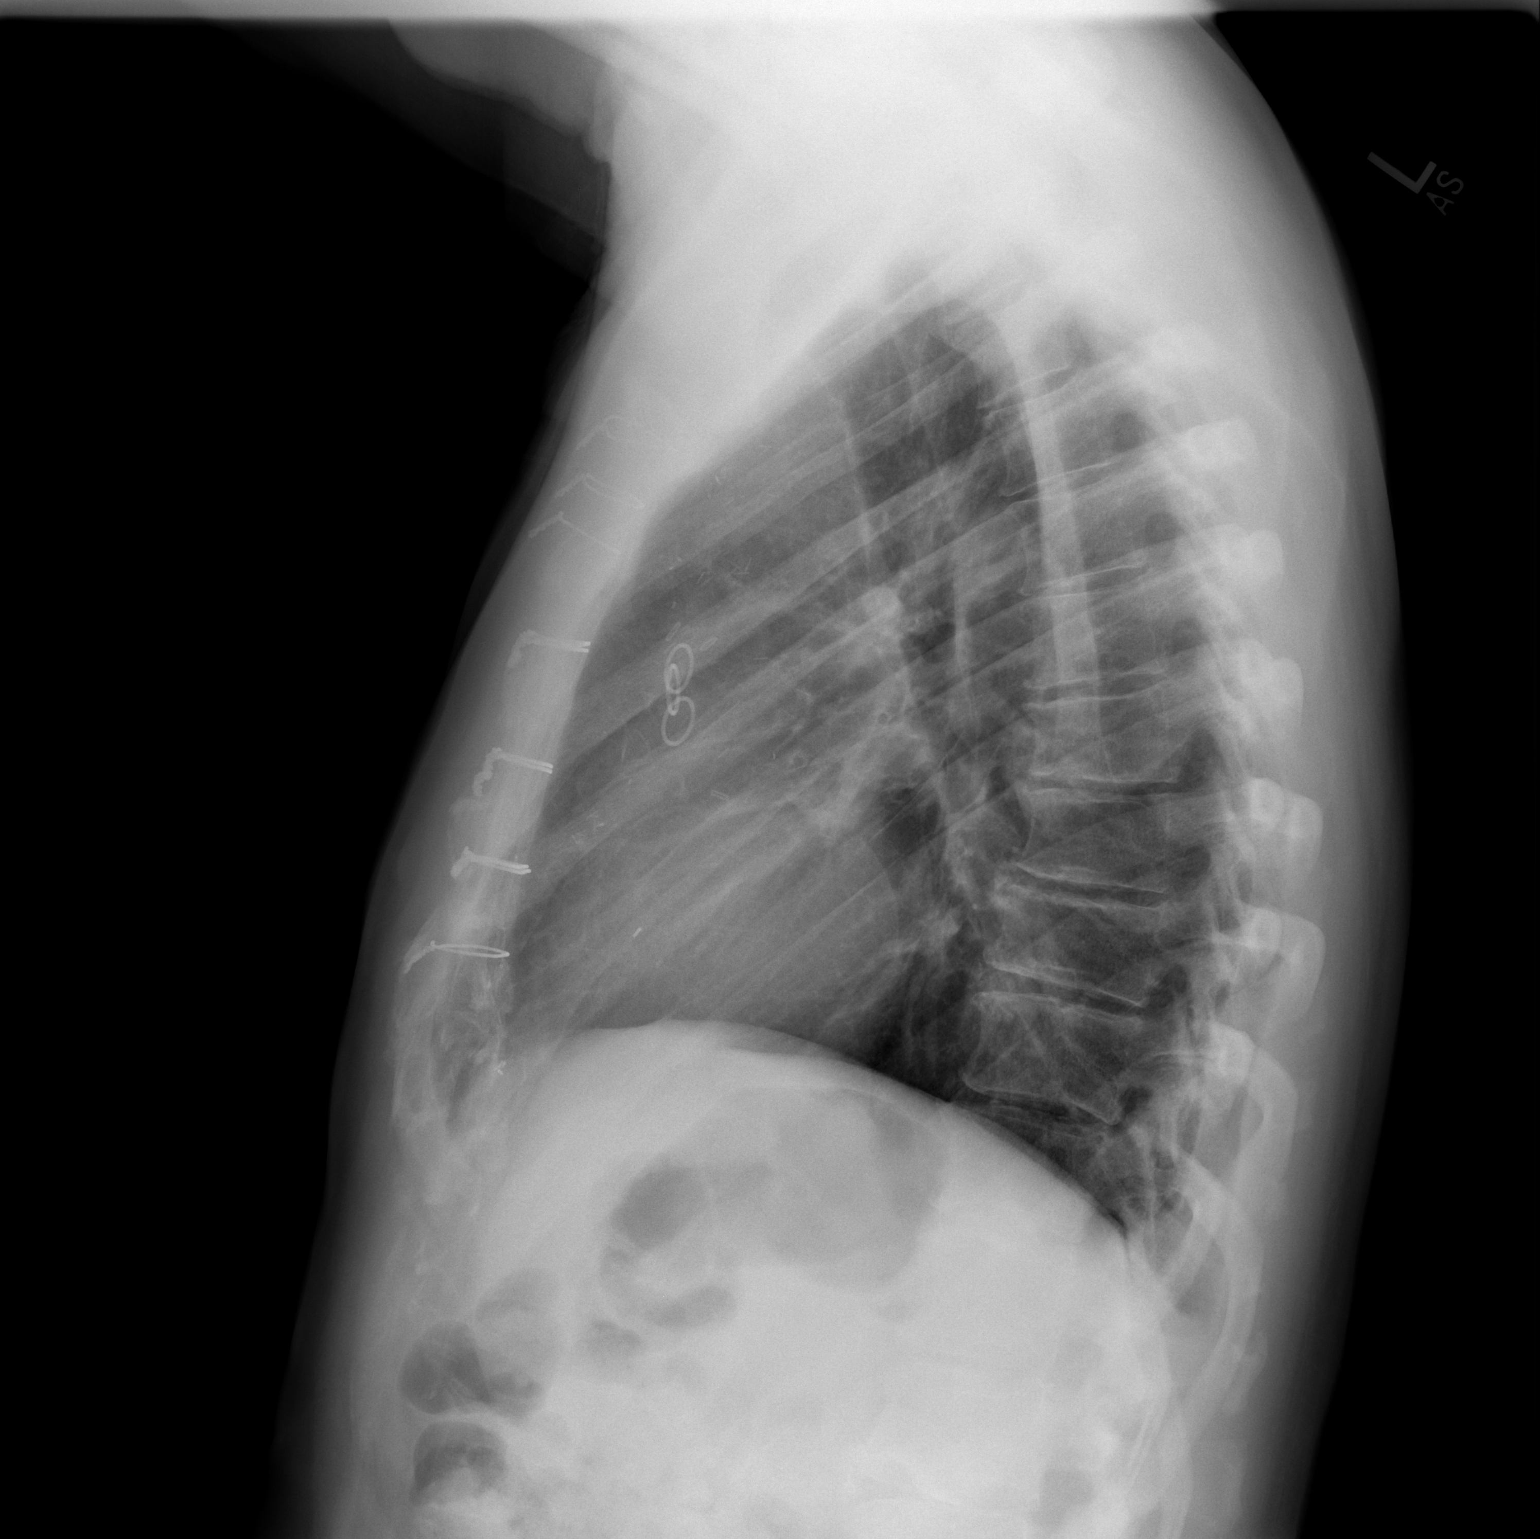

[2 of 2 positions shown; findings below may reference images not displayed]

FINDINGS: Prior CABG.  Interval clearance of the bibasilar
opacities and effusions.  No focal opacities or effusions
currently.  No acute bony abnormality.  Heart is normal size.
IMPRESSION: Prior CABG.  No acute findings.

## 2011-05-14 ENCOUNTER — Ambulatory Visit (HOSPITAL_COMMUNITY): Payer: No Typology Code available for payment source

## 2011-05-15 NOTE — Assessment & Plan Note (Signed)
OFFICE VISIT   Johnathan Rodriguez, Johnathan Rodriguez  DOB:  [birth year]                                        January 17, 2011  CHART #:  [id number]   CURRENT PROBLEMS:  1. Status post coronary artery bypass graft x4 on December 14, 2010,      for severe multivessel coronary disease with ST-elevation      myocardial infarction.  2. Postoperative atrial fibrillation with conversion to sinus rhythm.  3. Postoperative nausea.   HISTORY OF PRESENT ILLNESS:  The patient is a 66 year old ex-smoker who  presented with an acute ST-elevation MI in December and underwent urgent  CABG x4 with left IMA to the LAD and vein grafts to the second diagonal,  OM, and posterior descending.  Postoperatively, he did well with some  transient atrial fibrillation that converted to sinus rhythm on  amiodarone.  He was discharged home on the sixth postoperative day on  amiodarone, Crestor, aspirin, lisinopril, Synthroid, Lopressor, and  metformin, as well as Ultram, albuterol inhaler, and Advair Diskus.  He  turned to the emergency room on December 23, 2010, with nausea and  vomiting and was found to be mildly dehydrated.  His LFTs were normal  and his lipase was normal.  His white count was normal and his  hematocrit was baseline.  An urinalysis was mildly abnormal, but an  urine culture ultimately ruled out Pseudomonas.  The colony count was  75,000.  He apparently was not treated for this.  He did develop some  blood in his urine and has been evaluated by Dr. Jethro Bolus and  has an abdominal CT scan pending next week.  He continues to have nausea  and has had 12-15 pounds of weight loss since surgery.  He denies  abdominal pain, fever, diarrhea, hematemesis, or hematochezia.  He has  never had abdominal surgery before.  A KUB taken before he was  discharged showed some mild illness.   Recently, his amiodarone was reduced in half from 400 mg b.i.d. to 200  mg p.o. b.i.d.   PHYSICAL  EXAMINATION:  Blood pressure 110/70, pulse 60 and regular,  respirations 18, saturation 98%.  He appears somewhat gaunt and thinner.  HEENT exam is normocephalic.  His chest is well healed.  Cardiac rhythm  is regular, and there is no murmur or gallop.  The leg incision is well  healed.  There is no peripheral edema.   IMAGING:  A chest x-ray taken at his office visit with Dr. Donnie Aho 1 week  ago shows no pleural effusion, stable cardiac silhouette, and sternal  wires are all intact.   IMPRESSION AND PLAN:  The patient has had significant problems with  nausea.  Despite this, he has been able to walk 20-30 minutes a day and  denies recurrent angina.  His incisions are healing well.  He is not  taking any narcotics or iron or potassium.  I would suspect that his  nausea is due to use of the amiodarone or Crestor, both of which are new  medications and I have asked him to temporally stop these.  I gave him a  prescription for more Zofran which apparently helped his nausea in the  past after he was given this medication at his ER visit.  Certainly, the  CT scan should show evidence of gallstones although the  patient has no  abdominal tenderness.  I plan on seeing him back in approximately 2  weeks to reassess his general situation.  He is not ready to start  outpatient rehab until he resolves his nausea and starts gaining weight  back.   Kerin Perna, M.D.  Electronically Signed   PV/MEDQ  D:  01/17/2011  T:  01/17/2011  Job:  [id number]   cc:   Georga Hacking, M.D.  Donzetta Sprung

## 2011-05-16 ENCOUNTER — Ambulatory Visit (HOSPITAL_COMMUNITY): Payer: No Typology Code available for payment source

## 2011-05-18 ENCOUNTER — Ambulatory Visit (HOSPITAL_COMMUNITY): Payer: No Typology Code available for payment source

## 2011-05-18 NOTE — Procedures (Signed)
[facility]  Patient:    YON, SCHIFFMAN Visit Number: [id number] MRN: [id number]          Service Type: END Location: ENDO Attending Physician:  Louie Bun Dictated by:   Everardo All Madilyn Fireman, M.D. Proc. Date: 04/24/02 Admit Date:  04/24/2002   CC:         Gaynelle Cage, M.D.   Procedure Report  PROCEDURE:  Colonoscopy with polypectomy.  INDICATION FOR PROCEDURE:  Occasional rectal bleeding in a 66 year old patient with no prior colon screening.  DESCRIPTION OF PROCEDURE:  The patient was placed in the left lateral decubitus position and placed on the pulse monitor with continuous low-flow oxygen delivered by nasal cannula.  He was sedated with 50 mg of IV Demerol and 6 mg of IV Versed for the previous EGD, and no further sedation was required for this procedure.  The Olympus video colonoscope was inserted into the rectum and advanced to the cecum, confirmed by transillumination at McBurneys point and visualization of the ileocecal valve and appendiceal orifice.  The prep was good.  The cecum and ascending colon appeared normal with no masses, polyps, diverticula, or other mucosal abnormalities.  Within the transverse colon, there was seen a small sessile 8 mm polyp which was fulgurated by hot biopsy.  Two similar polyps were seen in the proximal ascending colon.  Within the sigmoid colon, no polyps were seen, but there were some small diverticula.  In the rectum, there were two polyps 8 and 6 mm in diameter, respectively, that were fulgurated by hot biopsy.  The colonoscope was then withdrawn, and the patient returned to the recovery room in stable condition.  He tolerated the procedure well, and there were no immediate complications.  IMPRESSION: 1. Five small left-sided colon polyps. 2. Mild sigmoid diverticulosis.  PLAN:  Await histology to determine method and interval for future colon screening. Dictated by:   Everardo All Madilyn Fireman,  M.D. Attending Physician:  Louie Bun DD:  04/24/02 TD:  04/24/02 Job: [id number]

## 2011-05-18 NOTE — Procedures (Signed)
[facility]  Patient:    Johnathan Rodriguez, Johnathan Rodriguez Visit Number: [id number] MRN: [id number]          Service Type: END Location: ENDO Attending Physician:  Louie Bun Dictated by:   Everardo All Madilyn Fireman, M.D. Proc. Date: 04/24/02 Admit Date:  04/24/2002   CC:         Blenda Mounts, M.D. [facility]  [phone] [place] [address]   Procedure Report  PROCEDURE:  Esophagogastroduodenoscopy.  INDICATION FOR PROCEDURE: Chronic gastroesophageal reflux with suboptimal response to medical therapy due to his age, chronicity and chronicity of symptoms and plans for colonoscopy today. We have decided to pursue EGD to assess for Barretts esophagus.  DESCRIPTION OF PROCEDURE:  The patient was placed in the left lateral decubitus position and placed on the pulse monitor with continuous low flow oxygen delivered via nasal cannula. He was sedated with 50 mg IV Demerol and 6 mg IV Versed. The Olympus video endoscope was advanced under direct vision into the oropharynx and esophagus. The esophagus was straight and of normal caliber at the squamocolumnar line at 37 cm above a 1-cm hiatal hernia. There were two broad erosions without exudate extending about 2 cm proximal to the squamocolumnar line but there was no apparent Barretts esophagus. There was also no ring or stricture. The stomach was entered and a small amount of liquid secretions were suctioned from the fundus. Retroflex view of the cardia confirmed a small hiatal hernia and it was otherwise unremarkable. The fundus, body, antrum, and pylorus all appeared normal. The duodenum was entered and both the bulb and second portion were well inspected and appeared to be within normal limits. The scope was then withdrawn and the patient returned to the recovery room in stable condition. He tolerated the procedure well and there were no immediate complications.  IMPRESSION:  Mild  erosive esophagitis with small hiatal hernia; otherwise normal endoscopy.  PLAN: 1. Increase proton pump inhibitor to b.i.d. dosing, if necessary. 2. Proceed with colonoscopy. Dictated by:   Everardo All Madilyn Fireman, M.D. Attending Physician:  Louie Bun DD:  04/24/02 TD:  04/24/02 Job: [id number]

## 2011-05-21 ENCOUNTER — Ambulatory Visit (HOSPITAL_COMMUNITY): Payer: No Typology Code available for payment source

## 2011-05-23 ENCOUNTER — Ambulatory Visit (HOSPITAL_COMMUNITY): Payer: No Typology Code available for payment source

## 2011-05-25 ENCOUNTER — Ambulatory Visit (HOSPITAL_COMMUNITY): Payer: No Typology Code available for payment source

## 2011-05-28 ENCOUNTER — Ambulatory Visit (HOSPITAL_COMMUNITY): Payer: No Typology Code available for payment source

## 2011-05-30 ENCOUNTER — Ambulatory Visit (HOSPITAL_COMMUNITY): Payer: No Typology Code available for payment source

## 2011-06-01 ENCOUNTER — Ambulatory Visit (HOSPITAL_COMMUNITY): Payer: No Typology Code available for payment source

## 2011-06-04 ENCOUNTER — Ambulatory Visit (HOSPITAL_COMMUNITY): Payer: No Typology Code available for payment source

## 2011-06-06 ENCOUNTER — Ambulatory Visit (HOSPITAL_COMMUNITY): Payer: No Typology Code available for payment source

## 2011-06-08 ENCOUNTER — Ambulatory Visit (HOSPITAL_COMMUNITY): Payer: No Typology Code available for payment source

## 2012-03-06 ENCOUNTER — Other Ambulatory Visit: Payer: Self-pay | Admitting: Orthopedic Surgery

## 2012-04-15 ENCOUNTER — Other Ambulatory Visit: Payer: Self-pay | Admitting: Orthopedic Surgery

## 2012-04-15 MED ORDER — BUPIVACAINE LIPOSOME 1.3 % IJ SUSP: 20.0000 mL | Freq: Once | INTRAMUSCULAR | Status: DC

## 2012-04-15 MED ORDER — DEXAMETHASONE SODIUM PHOSPHATE 10 MG/ML IJ SOLN: 10.0000 mg | Freq: Once | INTRAMUSCULAR | Status: DC

## 2012-05-02 ENCOUNTER — Encounter (HOSPITAL_COMMUNITY): Payer: Self-pay | Admitting: Pharmacy Technician

## 2012-05-08 ENCOUNTER — Ambulatory Visit (HOSPITAL_COMMUNITY)
Admission: RE | Admit: 2012-05-08 | Discharge: 2012-05-08 | Disposition: A | Discharge status: Home / Self Care | Payer: Medicare Other | Source: Ambulatory Visit | Attending: Orthopedic Surgery | Admitting: Orthopedic Surgery

## 2012-05-08 ENCOUNTER — Encounter (HOSPITAL_COMMUNITY)
Admission: RE | Admit: 2012-05-08 | Discharge: 2012-05-08 | Disposition: A | Discharge status: Home / Self Care | Payer: Medicare Other | Source: Ambulatory Visit | Attending: Orthopedic Surgery | Admitting: Orthopedic Surgery

## 2012-05-08 ENCOUNTER — Encounter (HOSPITAL_COMMUNITY): Payer: Self-pay

## 2012-05-08 DIAGNOSIS — Z01818 Encounter for other preprocedural examination: Secondary | ICD-10-CM | POA: Insufficient documentation

## 2012-05-08 DIAGNOSIS — Z951 Presence of aortocoronary bypass graft: Secondary | ICD-10-CM | POA: Insufficient documentation

## 2012-05-08 DIAGNOSIS — M1610 Unilateral primary osteoarthritis, unspecified hip: Secondary | ICD-10-CM | POA: Insufficient documentation

## 2012-05-08 DIAGNOSIS — M169 Osteoarthritis of hip, unspecified: Secondary | ICD-10-CM | POA: Insufficient documentation

## 2012-05-08 HISTORY — DX: Malignant (primary) neoplasm, unspecified: C80.1

## 2012-05-08 HISTORY — DX: Nausea with vomiting, unspecified: R11.2

## 2012-05-08 HISTORY — DX: Unspecified osteoarthritis, unspecified site: M19.90

## 2012-05-08 HISTORY — DX: Atherosclerotic heart disease of native coronary artery without angina pectoris: I25.10

## 2012-05-08 HISTORY — DX: Pure hypercholesterolemia, unspecified: E78.00

## 2012-05-08 HISTORY — DX: Other specified postprocedural states: Z98.890

## 2012-05-08 HISTORY — DX: Gastro-esophageal reflux disease without esophagitis: K21.9

## 2012-05-08 HISTORY — DX: Acute myocardial infarction, unspecified: I21.9

## 2012-05-08 HISTORY — DX: Polyneuropathy, unspecified: G62.9

## 2012-05-08 HISTORY — DX: Essential (primary) hypertension: I10

## 2012-05-08 LAB — URINALYSIS, ROUTINE W REFLEX MICROSCOPIC
Bilirubin Urine: NEGATIVE
Leukocytes, UA: NEGATIVE
Nitrite: NEGATIVE
Specific Gravity, Urine: 1.025 (ref 1.005–1.030)
pH: 6 (ref 5.0–8.0)

## 2012-05-08 LAB — PROTIME-INR: INR: 1.02 (ref 0.00–1.49)

## 2012-05-08 LAB — CBC
Platelets: 256 10*3/uL (ref 150–400)
RDW: 12.1 % (ref 11.5–15.5)
WBC: 6.9 10*3/uL (ref 4.0–10.5)

## 2012-05-08 LAB — COMPREHENSIVE METABOLIC PANEL
AST: 26 U/L (ref 0–37)
Albumin: 4.3 g/dL (ref 3.5–5.2)
Alkaline Phosphatase: 53 U/L (ref 39–117)
Chloride: 98 mEq/L (ref 96–112)
Creatinine, Ser: 1.17 mg/dL (ref 0.50–1.35)
Potassium: 3.9 mEq/L (ref 3.5–5.1)
Total Bilirubin: 0.5 mg/dL (ref 0.3–1.2)

## 2012-05-08 LAB — SURGICAL PCR SCREEN
MRSA, PCR: NEGATIVE
Staphylococcus aureus: POSITIVE — AB

## 2012-05-08 LAB — APTT: aPTT: 29 seconds (ref 24–37)

## 2012-05-08 MED ORDER — CHLORHEXIDINE GLUCONATE 4 % EX LIQD
60.0000 mL | Freq: Once | CUTANEOUS | Status: DC
  Filled 2012-05-08: qty 60

## 2012-05-08 NOTE — Patient Instructions (Addendum)
20 Johnathan Rodriguez  05/08/2012   Your procedure is scheduled on:  05/19/12  Report to SHORT STAY DEPT  at 12:45 PM.  Call this number if you have problems the morning of surgery: [phone]   Remember:   Do not eat food or drink liquids AFTER MIDNIGHT  May have clear liquids UNTIL 6 HOURS BEFORE SURGERY (9:15 AM )  Clear liquids include soda, tea, black coffee, apple or grape juice, broth.  Take these medicines the morning of surgery with A SIP OF WATER: PREVACID / METOPROLOL   Do not wear jewelry, make-up or nail polish.  Do not wear lotions, powders, or perfumes.   Do not shave legs or underarms 48 hrs. before surgery (men may shave face)  Do not bring valuables to the hospital.  Contacts, dentures or bridgework may not be worn into surgery.  Leave suitcase in the car. After surgery it may be brought to your room.  For patients admitted to the hospital, checkout time is 11:00 AM the day of discharge.   Patients discharged the day of surgery will not be allowed to drive home.    Special Instructions:   Please read over the following fact sheets that you were given: MRSA  Information / Incentive Spirometer               SHOWER WITH BETASEPT THE NIGHT BEFORE SURGERY AND THE MORNING OF SURGERY

## 2012-05-18 ENCOUNTER — Other Ambulatory Visit: Payer: Self-pay | Admitting: Orthopedic Surgery

## 2012-05-18 NOTE — H&P (Signed)
Johnathan Rodriguez  DOB: 25-Sep-1945 Married / Language: English / Race: White Male  Date of Admission:  05/19/2012  Chief Complaint:  Right Hip Pain  History of Present Illness The patient is a 67 year old male who comes in for a preoperative History and Physical. The patient is scheduled for a right total hip arthroplasty to be performed by Dr. Gus Rankin. Aluisio, MD at [facility] on 05/19/2012. The patient is a 67 year old male who presents today for follow up of their hip. The patient is being followed for their right hip pain and osteoarthritis. Symptoms reported today include: pain and stiffness. The patient feels that they are doing poorly and report their pain level to be mild to moderate. Current treatment includes: pain medications. The following medication has been used for pain control: Ultram. The patient presents today following intra-articular injection with Dr. Ethelene Hal; he states it helped for about a month. Unfortunately his hip is getting progressively worse. He is having more limitations in motion and function. He is unable to do most of what he desires. The pain is definitely a lot worse. He is not having any back pain or leg hip pain. He is getting pain radiating to his right knee. He is ready to proceed with surgery. They have been treated conservatively in the past for the above stated problem and despite conservative measures, they continue to have progressive pain and severe functional limitations and dysfunction. They have failed non-operative management. It is felt that they would benefit from undergoing total joint replacement. Risks and benefits of the procedure have been discussed with the patient and they elect to proceed with surgery. There are no active contraindications to surgery such as ongoing infection or rapidly progressive neurological disease.  Allergies ROPINIRole HCl *ANTIPARKINSON AGENTS*. Nausea.   Medication History MetFORMIN HCl  (500MG  Tablet, Oral two times daily) Active. Flax Oil Xtra (2 tabs Oral daily) Active. Prevacid 24HR (15MG  Capsule DR, Oral daily) Active. Aspirin Childrens (81MG  Tablet Chewable, Oral) Active. Lisinopril (2.5MG  Tablet, Oral daily) Active. Metoprolol Tartrate (25MG  Tablet, 1/2 Oral two times daily) Active. Crestor (40MG  Tablet, Oral daily) Active. Lyrica (100MG  Capsule, Oral at bedtime) Active.   Problem List/Past Medical Osteoarthritis, Hip (715.35) Diabetes Mellitus, Type II Skin Cancer Coronary Artery Disease/Heart Disease Myocardial infarction. 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 Diabetes  Past Surgical History Appendectomy Arthroscopy of Knee. bilateral Colon Polyp Removal - Colonoscopy Coronary Artery Bypass Graft. 4 or more vessels   Family History Heart Disease. mother and father Hypertension. mother Congestive Heart Failure. mother Cancer. mother Cerebrovascular Accident. father Father. Deceased. age 22 Mother. Deceased. age 53 Children. Son - age 65 - multiple heart sttacks.   Social History Alcohol use. current drinker; drinks beer; only occasionally per week current drinker; drinks beer and hard liquor; only occasionally per week Children. 2 Current work status. retired Financial planner (Currently). no Drug/Alcohol Rehab (Previously). no Exercise. Exercises daily Exercises daily; does running / walking Illicit drug use. no Living situation. live with spouse Marital status. married Number of flights of stairs before winded. greater than 5 Pain Contract. no Tobacco / smoke exposure. no Tobacco use. former smoker; smoke(d) 1 pack(s) per day former smoker; smoke(d) 1/2 pack(s) per day Post-Surgical Plans. Plan is to go home. Advance Directives. Living Will  Review of Systems General:Not Present- Chills,  Fever, Night Sweats, Fatigue, Weight Gain, Weight Loss and Memory Loss. Skin:Not Present- Hives, Itching, Rash,  Eczema and Lesions. HEENT:Not Present- Tinnitus, Headache, Double Vision, Visual Loss, Hearing Loss and Dentures. Respiratory:Not Present- Shortness of breath with exertion, Shortness of breath at rest, Allergies, Coughing up blood and Chronic Cough. Cardiovascular:Not Present- Chest Pain, Racing/skipping heartbeats, Difficulty Breathing Lying Down, Murmur, Swelling and Palpitations. Gastrointestinal:Not Present- Bloody Stool, Heartburn, Abdominal Pain, Vomiting, Nausea, Constipation, Diarrhea, Difficulty Swallowing, Jaundice and Loss of appetitie. Male Genitourinary:Not Present- Urinary frequency, Blood in Urine, Weak urinary stream, Discharge, Flank Pain, Incontinence, Painful Urination, Urgency, Urinary Retention and Urinating at Night. Musculoskeletal:Not Present- Muscle Weakness, Muscle Pain, Joint Swelling, Joint Pain, Back Pain, Morning Stiffness and Spasms. Neurological:Not Present- Tremor, Dizziness, Blackout spells, Paralysis, Difficulty with balance and Weakness. Psychiatric:Not Present- Insomnia.   Vitals Weight: 184 lb Height: 70 in Body Surface Area: 2.03 m Body Mass Index: 26.4 kg/m Pulse: 56 (Regular) Resp.: 14 (Unlabored) BP: 124/70 (Sitting, Right Arm, Standard)  Physical Exam The physical exam findings are as follows:   General Mental Status - Alert, cooperative and good historian. General Appearance- pleasant. Not in acute distress. Orientation- Oriented X3. Build & Nutrition- Well nourished and Well developed.   Head and Neck Head- normocephalic, atraumatic . Neck Global Assessment- supple. no bruit auscultated on the right and no bruit auscultated on the left.   Eye Pupil- Bilateral- Regular and Round. Note: wears glasses Motion- Bilateral- EOMI.   Chest and Lung Exam Auscultation: Breath sounds:- clear  at anterior chest wall and - clear at posterior chest wall. Adventitious sounds:- No Adventitious sounds.   Cardiovascular Auscultation:Rhythm- Regular rate and rhythm. Heart Sounds- S1 WNL and S2 WNL. Murmurs & Other Heart Sounds:Auscultation of the heart reveals - No Murmurs.   Abdomen Palpation/Percussion:Tenderness- Abdomen is non-tender to palpation. Rigidity (guarding)- Abdomen is soft. Auscultation:Auscultation of the abdomen reveals - Bowel sounds normal.   Male Genitourinary Not done, not pertinent to present illness  Musculoskeletal He is alert and oriented in no apparent distress. Left hip has normal range of motion. No discomfort. Right hip can be flexed to 100. No internal rotation, about 10-20 external rotation, 20 abduction. His gait pattern is very antalgic on the right.  RADIOGRAPHS: AP of pelvis, AP and lateral of right hip show his right hip to be bone on bone arthritic at this time.  Assessment & Plan Osteoarthritis Right Hip  Note: Patient is for a Right Total Hip Replacement by Dr. Lequita Halt.  Plan is to go home.  Cards - Dr. Donnie Aho - Patient has been seen preoperatively by Dr. Donnie Aho and felt to be stable for surgery. He did want the patient monitored on the telemitry floor postoperatively.  Signed electronically by Roberts Gaudy, PA-C

## 2012-05-19 ENCOUNTER — Encounter (HOSPITAL_COMMUNITY): Payer: Self-pay | Admitting: Anesthesiology

## 2012-05-19 ENCOUNTER — Encounter (HOSPITAL_COMMUNITY): Payer: Self-pay | Admitting: *Deleted

## 2012-05-19 ENCOUNTER — Ambulatory Visit (HOSPITAL_COMMUNITY): Payer: Medicare Other

## 2012-05-19 ENCOUNTER — Inpatient Hospital Stay (HOSPITAL_COMMUNITY)
Admission: RE | Admit: 2012-05-19 | Discharge: 2012-05-22 | DRG: 470 | Disposition: A | Discharge status: Home Health | Payer: Medicare Other | Source: Ambulatory Visit | Attending: Orthopedic Surgery | Admitting: Orthopedic Surgery

## 2012-05-19 ENCOUNTER — Encounter (HOSPITAL_COMMUNITY)
Admission: RE | Disposition: A | Discharge status: Home Health | Payer: Self-pay | Source: Ambulatory Visit | Attending: Orthopedic Surgery

## 2012-05-19 ENCOUNTER — Ambulatory Visit (HOSPITAL_COMMUNITY): Payer: Medicare Other | Admitting: Anesthesiology

## 2012-05-19 DIAGNOSIS — Z96649 Presence of unspecified artificial hip joint: Secondary | ICD-10-CM

## 2012-05-19 DIAGNOSIS — I252 Old myocardial infarction: Secondary | ICD-10-CM

## 2012-05-19 DIAGNOSIS — K219 Gastro-esophageal reflux disease without esophagitis: Secondary | ICD-10-CM | POA: Diagnosis present

## 2012-05-19 DIAGNOSIS — Z79899 Other long term (current) drug therapy: Secondary | ICD-10-CM

## 2012-05-19 DIAGNOSIS — M169 Osteoarthritis of hip, unspecified: Secondary | ICD-10-CM | POA: Diagnosis present

## 2012-05-19 DIAGNOSIS — M1610 Unilateral primary osteoarthritis, unspecified hip: Principal | ICD-10-CM | POA: Diagnosis present

## 2012-05-19 DIAGNOSIS — E7800 Pure hypercholesterolemia, unspecified: Secondary | ICD-10-CM | POA: Diagnosis present

## 2012-05-19 DIAGNOSIS — E1149 Type 2 diabetes mellitus with other diabetic neurological complication: Secondary | ICD-10-CM | POA: Diagnosis present

## 2012-05-19 DIAGNOSIS — E871 Hypo-osmolality and hyponatremia: Secondary | ICD-10-CM | POA: Diagnosis not present

## 2012-05-19 DIAGNOSIS — Z951 Presence of aortocoronary bypass graft: Secondary | ICD-10-CM

## 2012-05-19 DIAGNOSIS — I2510 Atherosclerotic heart disease of native coronary artery without angina pectoris: Secondary | ICD-10-CM | POA: Diagnosis present

## 2012-05-19 DIAGNOSIS — E1142 Type 2 diabetes mellitus with diabetic polyneuropathy: Secondary | ICD-10-CM | POA: Diagnosis present

## 2012-05-19 DIAGNOSIS — Z7982 Long term (current) use of aspirin: Secondary | ICD-10-CM

## 2012-05-19 HISTORY — PX: TOTAL HIP ARTHROPLASTY: SHX124

## 2012-05-19 LAB — TYPE AND SCREEN
ABO/RH(D): O NEG
Antibody Screen: NEGATIVE

## 2012-05-19 LAB — ABO/RH: ABO/RH(D): O NEG

## 2012-05-19 LAB — GLUCOSE, CAPILLARY: Glucose-Capillary: 99 mg/dL (ref 70–99)

## 2012-05-19 SURGERY — ARTHROPLASTY, HIP, TOTAL,POSTERIOR APPROACH
Anesthesia: General | Site: Hip | Laterality: Right | Wound class: Clean

## 2012-05-19 MED ORDER — ACETAMINOPHEN 10 MG/ML IV SOLN
1000.0000 mg | Freq: Four times a day (QID) | INTRAVENOUS | Status: AC
  Administered 2012-05-19 – 2012-05-20 (×4): 1000 mg via INTRAVENOUS
  Filled 2012-05-19 (×4): qty 100

## 2012-05-19 MED ORDER — DOCUSATE SODIUM 100 MG PO CAPS
100.0000 mg | ORAL_CAPSULE | Freq: Two times a day (BID) | ORAL | Status: DC
  Administered 2012-05-19 – 2012-05-22 (×6): 100 mg via ORAL
  Filled 2012-05-19 (×7): qty 1

## 2012-05-19 MED ORDER — HYDROMORPHONE HCL PF 1 MG/ML IJ SOLN
INTRAMUSCULAR | Status: DC | PRN
  Administered 2012-05-19 (×2): 1 mg via INTRAVENOUS

## 2012-05-19 MED ORDER — ACETAMINOPHEN 325 MG PO TABS: 650.0000 mg | ORAL_TABLET | Freq: Four times a day (QID) | ORAL | Status: DC | PRN

## 2012-05-19 MED ORDER — POTASSIUM CHLORIDE IN NACL 20-0.9 MEQ/L-% IV SOLN
INTRAVENOUS | Status: DC
  Administered 2012-05-19 – 2012-05-21 (×3): via INTRAVENOUS
  Filled 2012-05-19 (×3): qty 1000

## 2012-05-19 MED ORDER — LIDOCAINE HCL (CARDIAC) 20 MG/ML IV SOLN
INTRAVENOUS | Status: DC | PRN
  Administered 2012-05-19: 50 mg via INTRAVENOUS

## 2012-05-19 MED ORDER — MORPHINE SULFATE 2 MG/ML IJ SOLN
1.0000 mg | INTRAMUSCULAR | Status: DC | PRN
  Administered 2012-05-19 – 2012-05-21 (×11): 2 mg via INTRAVENOUS
  Filled 2012-05-19 (×11): qty 1

## 2012-05-19 MED ORDER — SODIUM CHLORIDE 0.9 % IV SOLN: INTRAVENOUS | Status: DC

## 2012-05-19 MED ORDER — SIMVASTATIN 20 MG PO TABS
20.0000 mg | ORAL_TABLET | Freq: Every day | ORAL | Status: DC
  Administered 2012-05-20 – 2012-05-21 (×2): 20 mg via ORAL
  Filled 2012-05-19 (×3): qty 1

## 2012-05-19 MED ORDER — CEFAZOLIN SODIUM-DEXTROSE 2-3 GM-% IV SOLR
2.0000 g | INTRAVENOUS | Status: AC
  Administered 2012-05-19: 2 g via INTRAVENOUS

## 2012-05-19 MED ORDER — NEOSTIGMINE METHYLSULFATE 1 MG/ML IJ SOLN
INTRAMUSCULAR | Status: DC | PRN
  Administered 2012-05-19: 4 mg via INTRAVENOUS

## 2012-05-19 MED ORDER — BISACODYL 10 MG RE SUPP: 10.0000 mg | Freq: Every day | RECTAL | Status: DC | PRN

## 2012-05-19 MED ORDER — PHENOL 1.4 % MT LIQD
1.0000 | OROMUCOSAL | Status: DC | PRN
  Filled 2012-05-19: qty 177

## 2012-05-19 MED ORDER — METOPROLOL TARTRATE 25 MG PO TABS
25.0000 mg | ORAL_TABLET | Freq: Two times a day (BID) | ORAL | Status: DC
  Administered 2012-05-19 – 2012-05-22 (×6): 25 mg via ORAL
  Filled 2012-05-19 (×7): qty 1

## 2012-05-19 MED ORDER — PANTOPRAZOLE SODIUM 40 MG PO TBEC
40.0000 mg | DELAYED_RELEASE_TABLET | Freq: Every day | ORAL | Status: DC
  Administered 2012-05-19: 40 mg via ORAL
  Filled 2012-05-19: qty 1

## 2012-05-19 MED ORDER — ONDANSETRON HCL 4 MG/2ML IJ SOLN
INTRAMUSCULAR | Status: DC | PRN
  Administered 2012-05-19: 4 mg via INTRAVENOUS

## 2012-05-19 MED ORDER — LACTATED RINGERS IV SOLN: INTRAVENOUS | Status: DC

## 2012-05-19 MED ORDER — PROMETHAZINE HCL 25 MG/ML IJ SOLN
INTRAMUSCULAR | Status: AC
  Filled 2012-05-19: qty 1

## 2012-05-19 MED ORDER — POTASSIUM CHLORIDE IN NACL 20-0.9 MEQ/L-% IV SOLN
INTRAVENOUS | Status: AC
  Filled 2012-05-19: qty 1000

## 2012-05-19 MED ORDER — ZOLPIDEM TARTRATE 5 MG PO TABS: 5.0000 mg | ORAL_TABLET | Freq: Every evening | ORAL | Status: DC | PRN

## 2012-05-19 MED ORDER — DEXAMETHASONE SODIUM PHOSPHATE 10 MG/ML IJ SOLN
10.0000 mg | Freq: Once | INTRAMUSCULAR | Status: DC
  Filled 2012-05-19: qty 1

## 2012-05-19 MED ORDER — LACTATED RINGERS IV SOLN
INTRAVENOUS | Status: DC
  Administered 2012-05-19: 16:00:00 via INTRAVENOUS
  Administered 2012-05-19: 1000 mL via INTRAVENOUS
  Administered 2012-05-19 (×2): via INTRAVENOUS

## 2012-05-19 MED ORDER — TEMAZEPAM 15 MG PO CAPS: 15.0000 mg | ORAL_CAPSULE | Freq: Every evening | ORAL | Status: DC | PRN

## 2012-05-19 MED ORDER — METOCLOPRAMIDE HCL 5 MG/ML IJ SOLN
INTRAMUSCULAR | Status: DC | PRN
  Administered 2012-05-19: 10 mg via INTRAVENOUS

## 2012-05-19 MED ORDER — CEFAZOLIN SODIUM-DEXTROSE 2-3 GM-% IV SOLR
INTRAVENOUS | Status: AC
  Filled 2012-05-19: qty 50

## 2012-05-19 MED ORDER — INSULIN ASPART 100 UNIT/ML ~~LOC~~ SOLN
0.0000 [IU] | Freq: Three times a day (TID) | SUBCUTANEOUS | Status: DC
  Administered 2012-05-21 – 2012-05-22 (×3): 2 [IU] via SUBCUTANEOUS

## 2012-05-19 MED ORDER — FLEET ENEMA 7-19 GM/118ML RE ENEM: 1.0000 | ENEMA | Freq: Once | RECTAL | Status: AC | PRN

## 2012-05-19 MED ORDER — EPHEDRINE SULFATE 50 MG/ML IJ SOLN
INTRAMUSCULAR | Status: DC | PRN
  Administered 2012-05-19: 5 mg via INTRAVENOUS

## 2012-05-19 MED ORDER — DIPHENHYDRAMINE HCL 12.5 MG/5ML PO ELIX: 12.5000 mg | ORAL_SOLUTION | ORAL | Status: DC | PRN

## 2012-05-19 MED ORDER — ACETAMINOPHEN 650 MG RE SUPP: 650.0000 mg | Freq: Four times a day (QID) | RECTAL | Status: DC | PRN

## 2012-05-19 MED ORDER — BUPIVACAINE LIPOSOME 1.3 % IJ SUSP
20.0000 mL | Freq: Once | INTRAMUSCULAR | Status: DC
  Filled 2012-05-19: qty 20

## 2012-05-19 MED ORDER — METFORMIN HCL 850 MG PO TABS
850.0000 mg | ORAL_TABLET | Freq: Two times a day (BID) | ORAL | Status: DC
  Administered 2012-05-19 – 2012-05-20 (×2): 850 mg via ORAL
  Filled 2012-05-19 (×4): qty 1

## 2012-05-19 MED ORDER — CEFAZOLIN SODIUM 1-5 GM-% IV SOLN
1.0000 g | Freq: Four times a day (QID) | INTRAVENOUS | Status: AC
  Administered 2012-05-19 – 2012-05-20 (×3): 1 g via INTRAVENOUS
  Filled 2012-05-19 (×3): qty 50

## 2012-05-19 MED ORDER — OXYCODONE HCL 5 MG PO TABS
5.0000 mg | ORAL_TABLET | ORAL | Status: DC | PRN
  Administered 2012-05-20 (×2): 5 mg via ORAL
  Administered 2012-05-20 – 2012-05-22 (×6): 10 mg via ORAL
  Filled 2012-05-19: qty 2
  Filled 2012-05-19: qty 1
  Filled 2012-05-19: qty 2
  Filled 2012-05-19: qty 1
  Filled 2012-05-19 (×4): qty 2

## 2012-05-19 MED ORDER — FENTANYL CITRATE 0.05 MG/ML IJ SOLN
INTRAMUSCULAR | Status: AC
  Filled 2012-05-19: qty 2

## 2012-05-19 MED ORDER — RIVAROXABAN 10 MG PO TABS
10.0000 mg | ORAL_TABLET | Freq: Every day | ORAL | Status: DC
  Administered 2012-05-20 – 2012-05-22 (×3): 10 mg via ORAL
  Filled 2012-05-19 (×4): qty 1

## 2012-05-19 MED ORDER — PREGABALIN 50 MG PO CAPS
100.0000 mg | ORAL_CAPSULE | Freq: Every day | ORAL | Status: DC
  Administered 2012-05-20 – 2012-05-21 (×2): 100 mg via ORAL
  Filled 2012-05-19 (×2): qty 2

## 2012-05-19 MED ORDER — MENTHOL 3 MG MT LOZG
1.0000 | LOZENGE | OROMUCOSAL | Status: DC | PRN
  Filled 2012-05-19: qty 9

## 2012-05-19 MED ORDER — 0.9 % SODIUM CHLORIDE (POUR BTL) OPTIME
TOPICAL | Status: DC | PRN
  Administered 2012-05-19: 1000 mL

## 2012-05-19 MED ORDER — METHOCARBAMOL 100 MG/ML IJ SOLN
500.0000 mg | Freq: Four times a day (QID) | INTRAVENOUS | Status: DC | PRN
  Administered 2012-05-19: 500 mg via INTRAVENOUS
  Filled 2012-05-19: qty 5

## 2012-05-19 MED ORDER — PROMETHAZINE HCL 25 MG/ML IJ SOLN
6.2500 mg | INTRAMUSCULAR | Status: DC | PRN
  Administered 2012-05-19: 6.25 mg via INTRAVENOUS

## 2012-05-19 MED ORDER — ACETAMINOPHEN 10 MG/ML IV SOLN
INTRAVENOUS | Status: AC
  Filled 2012-05-19: qty 100

## 2012-05-19 MED ORDER — ONDANSETRON HCL 4 MG PO TABS: 4.0000 mg | ORAL_TABLET | Freq: Four times a day (QID) | ORAL | Status: DC | PRN

## 2012-05-19 MED ORDER — MIDAZOLAM HCL 5 MG/5ML IJ SOLN
INTRAMUSCULAR | Status: DC | PRN
  Administered 2012-05-19: 2 mg via INTRAVENOUS

## 2012-05-19 MED ORDER — POLYETHYLENE GLYCOL 3350 17 G PO PACK
17.0000 g | PACK | Freq: Every day | ORAL | Status: DC | PRN
  Filled 2012-05-19: qty 1

## 2012-05-19 MED ORDER — METOCLOPRAMIDE HCL 5 MG/ML IJ SOLN: 5.0000 mg | Freq: Three times a day (TID) | INTRAMUSCULAR | Status: DC | PRN

## 2012-05-19 MED ORDER — FENTANYL CITRATE 0.05 MG/ML IJ SOLN
25.0000 ug | INTRAMUSCULAR | Status: DC | PRN
  Administered 2012-05-19 (×2): 50 ug via INTRAVENOUS

## 2012-05-19 MED ORDER — ONDANSETRON HCL 4 MG/2ML IJ SOLN
4.0000 mg | Freq: Four times a day (QID) | INTRAMUSCULAR | Status: DC | PRN
  Administered 2012-05-20: 4 mg via INTRAVENOUS
  Filled 2012-05-19: qty 2

## 2012-05-19 MED ORDER — BUPIVACAINE LIPOSOME 1.3 % IJ SUSP
INTRAMUSCULAR | Status: DC | PRN
  Administered 2012-05-19: 20 mL

## 2012-05-19 MED ORDER — GLYCOPYRROLATE 0.2 MG/ML IJ SOLN
INTRAMUSCULAR | Status: DC | PRN
  Administered 2012-05-19: .6 mg via INTRAVENOUS

## 2012-05-19 MED ORDER — METOCLOPRAMIDE HCL 10 MG PO TABS: 5.0000 mg | ORAL_TABLET | Freq: Three times a day (TID) | ORAL | Status: DC | PRN

## 2012-05-19 MED ORDER — FENTANYL CITRATE 0.05 MG/ML IJ SOLN
INTRAMUSCULAR | Status: DC | PRN
  Administered 2012-05-19 (×2): 50 ug via INTRAVENOUS
  Administered 2012-05-19: 100 ug via INTRAVENOUS
  Administered 2012-05-19: 50 ug via INTRAVENOUS

## 2012-05-19 MED ORDER — ACETAMINOPHEN 10 MG/ML IV SOLN
1000.0000 mg | Freq: Once | INTRAVENOUS | Status: AC
  Administered 2012-05-19: 1000 mg via INTRAVENOUS
  Filled 2012-05-19: qty 100

## 2012-05-19 MED ORDER — METHOCARBAMOL 500 MG PO TABS
500.0000 mg | ORAL_TABLET | Freq: Four times a day (QID) | ORAL | Status: DC | PRN
  Administered 2012-05-19 – 2012-05-20 (×2): 500 mg via ORAL
  Filled 2012-05-19 (×2): qty 1

## 2012-05-19 MED ORDER — PROPOFOL 10 MG/ML IV BOLUS
INTRAVENOUS | Status: DC | PRN
  Administered 2012-05-19: 170 mg via INTRAVENOUS

## 2012-05-19 MED ORDER — ROCURONIUM BROMIDE 100 MG/10ML IV SOLN
INTRAVENOUS | Status: DC | PRN
  Administered 2012-05-19: 50 mg via INTRAVENOUS

## 2012-05-19 SURGICAL SUPPLY — 52 items
BAG SPEC THK2 15X12 ZIP CLS (MISCELLANEOUS) ×1
BAG ZIPLOCK 12X15 (MISCELLANEOUS) ×2 IMPLANT
BIT DRILL 2.8X128 (BIT) ×2 IMPLANT
BLADE EXTENDED COATED 6.5IN (ELECTRODE) ×2 IMPLANT
BLADE SAW SAG 73X25 THK (BLADE) ×1
BLADE SAW SGTL 73X25 THK (BLADE) ×1 IMPLANT
CLOTH BEACON ORANGE TIMEOUT ST (SAFETY) ×2 IMPLANT
CLSR STERI-STRIP ANTIMIC 1/2X4 (GAUZE/BANDAGES/DRESSINGS) ×1 IMPLANT
DECANTER SPIKE VIAL GLASS SM (MISCELLANEOUS) ×2 IMPLANT
DRAPE INCISE IOBAN 66X45 STRL (DRAPES) ×2 IMPLANT
DRAPE ORTHO SPLIT 77X108 STRL (DRAPES) ×4
DRAPE POUCH INSTRU U-SHP 10X18 (DRAPES) ×2 IMPLANT
DRAPE SURG ORHT 6 SPLT 77X108 (DRAPES) ×2 IMPLANT
DRAPE U-SHAPE 47X51 STRL (DRAPES) ×2 IMPLANT
DRSG ADAPTIC 3X8 NADH LF (GAUZE/BANDAGES/DRESSINGS) ×2 IMPLANT
DRSG MEPILEX BORDER 4X4 (GAUZE/BANDAGES/DRESSINGS) ×2 IMPLANT
DRSG MEPILEX BORDER 4X8 (GAUZE/BANDAGES/DRESSINGS) ×2 IMPLANT
DURAPREP 26ML APPLICATOR (WOUND CARE) ×2 IMPLANT
ELECT REM PT RETURN 9FT ADLT (ELECTROSURGICAL) ×2
ELECTRODE REM PT RTRN 9FT ADLT (ELECTROSURGICAL) ×1 IMPLANT
EVACUATOR 1/8 PVC DRAIN (DRAIN) ×2 IMPLANT
FACESHIELD LNG OPTICON STERILE (SAFETY) ×8 IMPLANT
GLOVE BIO SURGEON STRL SZ7.5 (GLOVE) ×2 IMPLANT
GLOVE BIO SURGEON STRL SZ8 (GLOVE) ×2 IMPLANT
GLOVE BIOGEL PI IND STRL 8 (GLOVE) ×2 IMPLANT
GLOVE BIOGEL PI INDICATOR 8 (GLOVE) ×2
GOWN STRL NON-REIN LRG LVL3 (GOWN DISPOSABLE) ×2 IMPLANT
GOWN STRL REIN XL XLG (GOWN DISPOSABLE) ×2 IMPLANT
IMMOBILIZER KNEE 20 (SOFTGOODS) ×2
IMMOBILIZER KNEE 20 THIGH 36 (SOFTGOODS) IMPLANT
KIT BASIN OR (CUSTOM PROCEDURE TRAY) ×2 IMPLANT
MANIFOLD NEPTUNE II (INSTRUMENTS) ×2 IMPLANT
NDL SAFETY ECLIPSE 18X1.5 (NEEDLE) ×1 IMPLANT
NEEDLE HYPO 18GX1.5 SHARP (NEEDLE) ×2
NS IRRIG 1000ML POUR BTL (IV SOLUTION) ×2 IMPLANT
PACK TOTAL JOINT (CUSTOM PROCEDURE TRAY) ×2 IMPLANT
PASSER SUT SWANSON 36MM LOOP (INSTRUMENTS) ×2 IMPLANT
POSITIONER SURGICAL ARM (MISCELLANEOUS) ×2 IMPLANT
SPONGE GAUZE 4X4 12PLY (GAUZE/BANDAGES/DRESSINGS) ×2 IMPLANT
STRIP CLOSURE SKIN 1/2X4 (GAUZE/BANDAGES/DRESSINGS) ×4 IMPLANT
SUT ETHIBOND NAB CT1 #1 30IN (SUTURE) ×4 IMPLANT
SUT MNCRL AB 4-0 PS2 18 (SUTURE) ×2 IMPLANT
SUT VIC AB 1 CT1 27 (SUTURE) ×4
SUT VIC AB 1 CT1 27XBRD ANTBC (SUTURE) ×2 IMPLANT
SUT VIC AB 2-0 CT1 27 (SUTURE) ×6
SUT VIC AB 2-0 CT1 TAPERPNT 27 (SUTURE) ×3 IMPLANT
SUT VLOC 180 0 24IN GS25 (SUTURE) ×4 IMPLANT
SYR 50ML LL SCALE MARK (SYRINGE) ×2 IMPLANT
TOWEL OR 17X26 10 PK STRL BLUE (TOWEL DISPOSABLE) ×4 IMPLANT
TOWEL OR NON WOVEN STRL DISP B (DISPOSABLE) ×2 IMPLANT
TRAY FOLEY CATH 14FRSI W/METER (CATHETERS) ×2 IMPLANT
WATER STERILE IRR 1500ML POUR (IV SOLUTION) ×2 IMPLANT

## 2012-05-19 NOTE — Transfer of Care (Signed)
Immediate Anesthesia Transfer of Care Note  Patient: Johnathan Rodriguez  Procedure(s) Performed: Procedure(s) (LRB): TOTAL HIP ARTHROPLASTY (Right)  Patient Location: PACU  Anesthesia Type: General  Level of Consciousness: oriented and patient cooperative  Airway & Oxygen Therapy: Patient Spontanous Breathing and Patient connected to face mask oxygen  Post-op Assessment: Report given to PACU RN  Post vital signs: Reviewed and stable  Complications: No apparent anesthesia complications

## 2012-05-19 NOTE — Anesthesia Preprocedure Evaluation (Addendum)
Anesthesia Evaluation  Patient identified by MRN, date of birth, ID band Patient awake    Reviewed: Allergy & Precautions, H&P , NPO status , Patient's Chart, lab work & pertinent test results  History of Anesthesia Complications (+) PONV  Airway Mallampati: II TM Distance: >3 FB Neck ROM: Full    Dental  (+) Missing, Dental Advisory Given and Poor Dentition,    Pulmonary asthma ,  breath sounds clear to auscultation  Pulmonary exam normal       Cardiovascular hypertension, Pt. on medications + CAD, + Past MI, + Cardiac Stents and + CABG (CABG 2012) Rhythm:Regular Rate:Normal  Cardiac clearance given. EF 50-%   Neuro/Psych  Neuromuscular disease negative psych ROS   GI/Hepatic Neg liver ROS, GERD-  Medicated,  Endo/Other  Diabetes mellitus-, Well Controlled, Type 2, Oral Hypoglycemic Agents  Renal/GU negative Renal ROS  negative genitourinary   Musculoskeletal negative musculoskeletal ROS (+)   Abdominal   Peds  Hematology negative hematology ROS (+)   Anesthesia Other Findings   Reproductive/Obstetrics negative OB ROS                         Anesthesia Physical Anesthesia Plan  ASA: III  Anesthesia Plan: General   Post-op Pain Management:    Induction: Intravenous  Airway Management Planned: Oral ETT  Additional Equipment:   Intra-op Plan:   Post-operative Plan: Extubation in OR  Informed Consent: I have reviewed the patients History and Physical, chart, labs and discussed the procedure including the risks, benefits and alternatives for the proposed anesthesia with the patient or authorized representative who has indicated his/her understanding and acceptance.   Dental advisory given  Plan Discussed with: CRNA  Anesthesia Plan Comments:         Anesthesia Quick Evaluation

## 2012-05-19 NOTE — H&P (View-Only) (Signed)
Johnathan Rodriguez  DOB: 11/08/1945 Married / Language: English / Race: White Male  Date of Admission:  05/19/2012  Chief Complaint:  Right Hip Pain  History of Present Illness The patient is a 66 year old male who comes in for a preoperative History and Physical. The patient is scheduled for a right total hip arthroplasty to be performed by Dr. Frank V. Aluisio, MD at [facility] on 05/19/2012. The patient is a 66 year old male who presents today for follow up of their hip. The patient is being followed for their right hip pain and osteoarthritis. Symptoms reported today include: pain and stiffness. The patient feels that they are doing poorly and report their pain level to be mild to moderate. Current treatment includes: pain medications. The following medication has been used for pain control: Ultram. The patient presents today following intra-articular injection with Dr. Ramos; he states it helped for about a month. Unfortunately his hip is getting progressively worse. He is having more limitations in motion and function. He is unable to do most of what he desires. The pain is definitely a lot worse. He is not having any back pain or leg hip pain. He is getting pain radiating to his right knee. He is ready to proceed with surgery. They have been treated conservatively in the past for the above stated problem and despite conservative measures, they continue to have progressive pain and severe functional limitations and dysfunction. They have failed non-operative management. It is felt that they would benefit from undergoing total joint replacement. Risks and benefits of the procedure have been discussed with the patient and they elect to proceed with surgery. There are no active contraindications to surgery such as ongoing infection or rapidly progressive neurological disease.  Allergies ROPINIRole HCl *ANTIPARKINSON AGENTS*. Nausea.   Medication History MetFORMIN HCl  (500MG Tablet, Oral two times daily) Active. Flax Oil Xtra (2 tabs Oral daily) Active. Prevacid 24HR (15MG Capsule DR, Oral daily) Active. Aspirin Childrens (81MG Tablet Chewable, Oral) Active. Lisinopril (2.5MG Tablet, Oral daily) Active. Metoprolol Tartrate (25MG Tablet, 1/2 Oral two times daily) Active. Crestor (40MG Tablet, Oral daily) Active. Lyrica (100MG Capsule, Oral at bedtime) Active.   Problem List/Past Medical Osteoarthritis, Hip (715.35) Diabetes Mellitus, Type II Skin Cancer Coronary Artery Disease/Heart Disease Myocardial infarction. 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 Diabetes  Past Surgical History Appendectomy Arthroscopy of Knee. bilateral Colon Polyp Removal - Colonoscopy Coronary Artery Bypass Graft. 4 or more vessels   Family History Heart Disease. mother and father Hypertension. mother Congestive Heart Failure. mother Cancer. mother Cerebrovascular Accident. father Father. Deceased. age 90 Mother. Deceased. age 89 Children. Son - age 43 - multiple heart sttacks.   Social History Alcohol use. current drinker; drinks beer; only occasionally per week current drinker; drinks beer and hard liquor; only occasionally per week Children. 2 Current work status. retired Drug/Alcohol Rehab (Currently). no Drug/Alcohol Rehab (Previously). no Exercise. Exercises daily Exercises daily; does running / walking Illicit drug use. no Living situation. live with spouse Marital status. married Number of flights of stairs before winded. greater than 5 Pain Contract. no Tobacco / smoke exposure. no Tobacco use. former smoker; smoke(d) 1 pack(s) per day former smoker; smoke(d) 1/2 pack(s) per day Post-Surgical Plans. Plan is to go home. Advance Directives. Living Will  Review of Systems General:Not Present- Chills,  Fever, Night Sweats, Fatigue, Weight Gain, Weight Loss and Memory Loss. Skin:Not Present- Hives, Itching, Rash,   Eczema and Lesions. HEENT:Not Present- Tinnitus, Headache, Double Vision, Visual Loss, Hearing Loss and Dentures. Respiratory:Not Present- Shortness of breath with exertion, Shortness of breath at rest, Allergies, Coughing up blood and Chronic Cough. Cardiovascular:Not Present- Chest Pain, Racing/skipping heartbeats, Difficulty Breathing Lying Down, Murmur, Swelling and Palpitations. Gastrointestinal:Not Present- Bloody Stool, Heartburn, Abdominal Pain, Vomiting, Nausea, Constipation, Diarrhea, Difficulty Swallowing, Jaundice and Loss of appetitie. Male Genitourinary:Not Present- Urinary frequency, Blood in Urine, Weak urinary stream, Discharge, Flank Pain, Incontinence, Painful Urination, Urgency, Urinary Retention and Urinating at Night. Musculoskeletal:Not Present- Muscle Weakness, Muscle Pain, Joint Swelling, Joint Pain, Back Pain, Morning Stiffness and Spasms. Neurological:Not Present- Tremor, Dizziness, Blackout spells, Paralysis, Difficulty with balance and Weakness. Psychiatric:Not Present- Insomnia.   Vitals Weight: 184 lb Height: 70 in Body Surface Area: 2.03 m Body Mass Index: 26.4 kg/m Pulse: 56 (Regular) Resp.: 14 (Unlabored) BP: 124/70 (Sitting, Right Arm, Standard)  Physical Exam The physical exam findings are as follows:   General Mental Status - Alert, cooperative and good historian. General Appearance- pleasant. Not in acute distress. Orientation- Oriented X3. Build & Nutrition- Well nourished and Well developed.   Head and Neck Head- normocephalic, atraumatic . Neck Global Assessment- supple. no bruit auscultated on the right and no bruit auscultated on the left.   Eye Pupil- Bilateral- Regular and Round. Note: wears glasses Motion- Bilateral- EOMI.   Chest and Lung Exam Auscultation: Breath sounds:- clear  at anterior chest wall and - clear at posterior chest wall. Adventitious sounds:- No Adventitious sounds.   Cardiovascular Auscultation:Rhythm- Regular rate and rhythm. Heart Sounds- S1 WNL and S2 WNL. Murmurs & Other Heart Sounds:Auscultation of the heart reveals - No Murmurs.   Abdomen Palpation/Percussion:Tenderness- Abdomen is non-tender to palpation. Rigidity (guarding)- Abdomen is soft. Auscultation:Auscultation of the abdomen reveals - Bowel sounds normal.   Male Genitourinary Not done, not pertinent to present illness  Musculoskeletal He is alert and oriented in no apparent distress. Left hip has normal range of motion. No discomfort. Right hip can be flexed to 100. No internal rotation, about 10-20 external rotation, 20 abduction. His gait pattern is very antalgic on the right.  RADIOGRAPHS: AP of pelvis, AP and lateral of right hip show his right hip to be bone on bone arthritic at this time.  Assessment & Plan Osteoarthritis Right Hip  Note: Patient is for a Right Total Hip Replacement by Dr. Aluisio.  Plan is to go home.  Cards - Dr. Tilley - Patient has been seen preoperatively by Dr. Tilley and felt to be stable for surgery. He did want the patient monitored on the telemitry floor postoperatively.  Signed electronically by DREW L Vallarie Fei, PA-C 

## 2012-05-19 NOTE — Anesthesia Postprocedure Evaluation (Signed)
Anesthesia Post Note  Patient: Johnathan Rodriguez  Procedure(s) Performed: Procedure(s) (LRB): TOTAL HIP ARTHROPLASTY (Right)  Anesthesia type: General  Patient location: PACU  Post pain: Pain level controlled  Post assessment: Post-op Vital signs reviewed  Last Vitals:  Filed Vitals:   05/19/12 1730  BP: 128/66  Pulse: 76  Temp:   Resp: 15    Post vital signs: Reviewed  Level of consciousness: sedated  Complications: No apparent anesthesia complications

## 2012-05-19 NOTE — Interval H&P Note (Signed)
History and Physical Interval Note:  05/19/2012 2:56 PM  Johnathan Rodriguez  has presented today for surgery, with the diagnosis of Osteoarthritis of the Right Hip  The various methods of treatment have been discussed with the patient and family. After consideration of risks, benefits and other options for treatment, the patient has consented to  Procedure(s) (LRB): TOTAL HIP ARTHROPLASTY (Right) as a surgical intervention .  The patients' history has been reviewed, patient examined, no change in status, stable for surgery.  I have reviewed the patients' chart and labs.  Questions were answered to the patient's satisfaction.     Loanne Drilling

## 2012-05-19 NOTE — Op Note (Signed)
Pre-operative diagnosis- Osteoarthritis Right hip  Post-operative diagnosis- Osteoarthritis  Right hip  Procedure-  RightTotal Hip Arthroplasty  Surgeon- Gus Rankin. Jerimyah Vandunk, MD  Assistant- Avel Peace, PA-C   Anesthesia  General  EBL- 400   Drain Hemovac   Complication- None  Condition-PACU - hemodynamically stable.   Brief Clinical Note- Johnathan Rodriguez is a 67 y.o. male with end stage arthritis of his right hip with progressively worsening pain and dysfunction. Pain occurs with activity and rest including pain at night. He has tried analgesics, protected weight bearing and rest without benefit. Pain is too severe to attempt physical therapy. Radiographs demonstrate bone on bone arthritis with subchondral cyst formation. He presents now for right THA.  Procedure in detail-   The patient is brought into the operating room and placed on the operating table. After successful administration of General  anesthesia, the patient is placed in the  Left lateral decubitus position with the  Right side up and held in place with the hip positioner. The lower extremity is isolated from the perineum with plastic drapes and time-out is performed by the surgical team. The lower extremity is then prepped and draped in the usual sterile fashion. A short posterolateral incision is made with a ten blade through the subcutaneous tissue to the level of the fascia lata which is incised in line with the skin incision. The sciatic nerve is palpated and protected and the short external rotators and capsule are isolated from the femur. The hip is then dislocated and the center of the femoral head is marked. A trial prosthesis is placed such that the trial head corresponds to the center of the patients' native femoral head. The resection level is marked on the femoral neck and the resection is made with an oscillating saw. The femoral head is removed and femoral retractors placed to gain access to the femoral canal.     The canal finder is passed into the femoral canal and the canal is thoroughly irrigated with sterile saline to remove the fatty contents. Axial reaming is performed to 13.5  mm, proximal reaming to 18B  and the sleeve machined to a large. A 18B large trial sleeve is placed into the proximal femur.      The femur is then retracted anteriorly to gain acetabular exposure. Acetabular retractors are placed and the labrum and osteophytes are removed, Acetabular reaming is performed to 53  mm and a 54  mm Pinnacle acetabular shell is placed in anatomic position with excellent purchase. Additional dome screws were not needed. An apex hole eliminator is placed and the permanent 36 mm neutral plus 4 Marathon liner is placed into the acetabular shell.      The trial femur is then placed into the femoral canal. The size is 18 x 13  stem with a 36 + 12  neck and a 36 + 0 head with the neck version 10 degrees beyond  the patients' native anteversion. The hip is reduced with excellent stability with full extension and full external rotation, 70 degrees flexion with 40 degrees adduction and 90 degrees internal rotation and 90 degrees of flexion with 70 degrees of internal rotation. The operative leg is placed on top of the non-operative leg and the leg lengths are found to be equal. The trials are then removed and the permanent implant of the same size is impacted into the femoral canal. The ceramic femoral head of the same size as the trial is placed and the hip is  reduced with the same stability parameters. The operative leg is again placed on top of the non-operative leg and the leg lengths are found to be equal.      The wound is then copiously irrigated with saline solution and the capsule and short external rotators are re-attached to the femur through drill holes with Ethibond suture. The fascia lata is closed over a hemovac drain with #1 vicryl suture and the fascia lata, gluteal muscles and subcutaneous tissues are  injected with Exparel 20ml diluted with saline 50ml. The subcutaneous tissues are closed with #1 and2-0 vicryl and the subcuticular layer closed with running 4-0 Monocryl. The drain is hooked to suction, incision cleaned and dried, and steri-srips and a bulky sterile dressing applied. The limb is placed into a knee immobilizer and the patient is awakened and transported to recovery in stable condition.      Please note that a surgical assistant was a medical necessity for this procedure in order to perform it in a safe and expeditious manner. The assistant was necessary to provide retraction to the vital neurovascular structures and to retract and position the limb to allow for anatomic placement of the prosthetic components.  Gus Rankin Johnathan Wooden, MD    05/19/2012, 4:33 PM

## 2012-05-20 ENCOUNTER — Encounter (HOSPITAL_COMMUNITY): Payer: Self-pay | Admitting: Orthopedic Surgery

## 2012-05-20 DIAGNOSIS — E871 Hypo-osmolality and hyponatremia: Secondary | ICD-10-CM | POA: Diagnosis not present

## 2012-05-20 LAB — BASIC METABOLIC PANEL
BUN: 16 mg/dL (ref 6–23)
CO2: 29 mEq/L (ref 19–32)
Calcium: 8.6 mg/dL (ref 8.4–10.5)
Chloride: 96 mEq/L (ref 96–112)
Creatinine, Ser: 1.13 mg/dL (ref 0.50–1.35)
GFR calc Af Amer: 76 mL/min — ABNORMAL LOW (ref >90)

## 2012-05-20 LAB — CBC
HCT: 38.6 % — ABNORMAL LOW (ref 39.0–52.0)
MCH: 31.7 pg (ref 26.0–34.0)
MCV: 92.1 fL (ref 78.0–100.0)
Platelets: 227 10*3/uL (ref 150–400)
RDW: 12.4 % (ref 11.5–15.5)
WBC: 9.1 10*3/uL (ref 4.0–10.5)

## 2012-05-20 MED ORDER — LANSOPRAZOLE 30 MG PO CPDR
30.0000 mg | DELAYED_RELEASE_CAPSULE | Freq: Every day | ORAL | Status: DC
  Administered 2012-05-20 – 2012-05-21 (×2): 30 mg via ORAL
  Filled 2012-05-20 (×3): qty 1

## 2012-05-20 MED ORDER — NON FORMULARY: 30.0000 mg | Freq: Every day | Status: DC

## 2012-05-20 NOTE — Progress Notes (Signed)
Utilization review completed.  

## 2012-05-20 NOTE — Evaluation (Signed)
Physical Therapy Evaluation Patient Details Name: Johnathan Rodriguez MRN: [id number] DOB: 1945/03/28 Today's Date: 05/20/2012 Time: 4098-1191 PT Time Calculation (min): 22 min  PT Assessment / Plan / Recommendation Clinical Impression  67 yo male s/p R THA. Pt currently experiencing moderate-severe pain in R hip at rest and with activity. Pt agreeable to OOB with PT. Plan is for HHPT but pt states he may have to go to ST rehab if wife will have to assist physically with mobility. Anticipate pt will progress well with mobility with better pain control.     PT Assessment  Patient needs continued PT services    Follow Up Recommendations  Home health PT vs Skilled nursing facility (depending on progress)   Barriers to Discharge Decreased caregiver support wife able to assist but only minimally.     lEquipment Recommendations  None recommended by PT    Recommendations for Other Services OT consult   Frequency 7X/week    Precautions / Restrictions Precautions Precautions: Posterior Hip Precaution Comments: Reviewed total hip precautions and PWB status Restrictions Weight Bearing Restrictions: Yes RLE Weight Bearing: Partial weight bearing RLE Partial Weight Bearing Percentage or Pounds: 25-50%   Pertinent Vitals/Pain       Mobility  Bed Mobility Bed Mobility: Supine to Sit Supine to Sit: 3: Mod assist Details for Bed Mobility Assistance: VCs safety, technique, hand placement. Increased difficulty with trunk to upright. Assist for trunk, R LE off EOB and utilized bed pad for scooting. Transfers Transfers: Sit to Stand;Stand to Sit Sit to Stand: 4: Min assist;With upper extremity assist;From elevated surface Stand to Sit: 4: Min assist;With upper extremity assist;To chair/3-in-1;With armrests Details for Transfer Assistance: VCs safety, technique, hand placement. Assist to rise, stabilize, controld descent.  Ambulation/Gait Ambulation/Gait Assistance: 4: Min assist Ambulation  Distance (Feet): 25 Feet Assistive device: Rolling walker Ambulation/Gait Assistance Details: VCs safety, technique, sequence. Limited by tolerance and pain. Pt reluctant to put weight through R LE and using NWB/TTWB status-encouraged pt to put more weight through R LE throughout ambulation. Fatigues easily.  Gait Pattern: Step-to pattern;Decreased step length - right;Decreased step length - left;Decreased stride length;Antalgic    Exercises     PT Diagnosis: Difficulty walking;Abnormality of gait;Acute pain  PT Problem List: Decreased strength;Decreased range of motion;Decreased activity tolerance;Decreased mobility;Pain;Decreased knowledge of precautions;Decreased knowledge of use of DME PT Treatment Interventions: DME instruction;Gait training;Stair training;Functional mobility training;Therapeutic activities;Therapeutic exercise;Patient/family education   PT Goals Acute Rehab PT Goals PT Goal Formulation: With patient Time For Goal Achievement: 05/27/12 Potential to Achieve Goals: Good Pt will go Supine/Side to Sit: with supervision PT Goal: Supine/Side to Sit - Progress: Goal set today Pt will go Sit to Supine/Side: with supervision PT Goal: Sit to Supine/Side - Progress: Goal set today Pt will go Sit to Stand: with supervision PT Goal: Sit to Stand - Progress: Goal set today Pt will go Stand to Sit: with supervision PT Goal: Stand to Sit - Progress: Goal set today Pt will Ambulate: >150 feet;with supervision;with rolling walker PT Goal: Ambulate - Progress: Goal set today Pt will Go Up / Down Stairs: 3-5 stairs;with least restrictive assistive device PT Goal: Up/Down Stairs - Progress: Goal set today  Visit Information  Last PT Received On: 05/20/12 Assistance Needed: +1    Subjective Data  Subjective: "I can't control my pain" Patient Stated Goal: Home   Prior Functioning  Home Living Lives With: Spouse Available Help at Discharge: Family Type of Home: House Home  Access: Stairs to enter Entrance  Stairs-Number of Steps: 5, then landing + 3, +3  Entrance Stairs-Rails: None Home Layout: One level;Able to live on main level with bedroom/bathroom Bathroom Shower/Tub: Health visitor: Standard Home Adaptive Equipment: Built-in shower seat;Walker - rolling;Crutches;Bedside commode/3-in-1 Prior Function Level of Independence: Independent Able to Take Stairs?: Yes Driving: Yes Communication Communication: No difficulties    Cognition  Overall Cognitive Status: Appears within functional limits for tasks assessed/performed Arousal/Alertness: Awake/alert Orientation Level: Appears intact for tasks assessed Behavior During Session: [facility] for tasks performed    Extremity/Trunk Assessment Right Lower Extremity Assessment RLE ROM/Strength/Tone: Unable to fully assess;Due to pain Left Lower Extremity Assessment LLE ROM/Strength/Tone: WFL for tasks assessed LLE Coordination: WFL - gross motor   Balance    End of Session PT - End of Session Equipment Utilized During Treatment: Gait belt Activity Tolerance: Patient limited by fatigue;Patient limited by pain Patient left: in chair;with call bell/phone within reach   [name] Alert [facility] 05/20/2012, 10:28 AM [phone]

## 2012-05-20 NOTE — Progress Notes (Signed)
Subjective: 1 Day Post-Op Procedure(s) (LRB): TOTAL HIP ARTHROPLASTY (Right) Patient reports pain as mild.  Sitting up in bed. Eating OK. No complaints of CP or SOB. Patient seen in rounds with Dr. Lequita Halt. Patient is well, but has had some minor complaints of pain in the hip, requiring pain medications We will start therapy today. Currently on Tele bed as requested. Plan is to go Home after hospital stay.  Objective: Vital signs in last 24 hours: Temp:  [97.2 F (36.2 C)-98.1 F (36.7 C)] 98.1 F (36.7 C) (05/21 0506) Pulse Rate:  [72-87] 82  (05/21 0506) Resp:  [8-18] 18  (05/21 0506) BP: (105-146)/(66-89) 105/71 mmHg (05/21 0506) SpO2:  [93 %-100 %] 98 % (05/21 0506) Weight:  [85.73 kg (189 lb)] 85.73 kg (189 lb) (05/20 1807)  Intake/Output from previous day:  Intake/Output Summary (Last 24 hours) at 05/20/12 0900 Last data filed at 05/20/12 0600  Gross per 24 hour  Intake 3658.75 ml  Output   1485 ml  Net 2173.75 ml    Intake/Output this shift: UOP 600 since MN  Labs:  [facility] 05/20/12 0443  HGB 13.3    Basename 05/20/12 0443  WBC 9.1  RBC 4.19*  HCT 38.6*  PLT 227    Basename 05/20/12 0443  NA 134*  K 4.3  CL 96  CO2 29  BUN 16  CREATININE 1.13  GLUCOSE 127*  CALCIUM 8.6   No results found for this basename: LABPT:2,INR:2 in the last 72 hours  EXAM General - Patient is Alert, Appropriate and Oriented Extremity - Neurovascular intact Sensation intact distally Dorsiflexion/Plantar flexion intact Dressing - dressing C/D/I Motor Function - intact, moving foot and toes well on exam.  Hemovac pulled without difficulty.  Past Medical History  Diagnosis Date  . PONV (postoperative nausea and vomiting) No complaints this morining  . Myocardial infarction No complaints this morning  . Hypertension BP stable this AM.  Lisinopril is on hold for now.  BP soft last night but asymptomatic at this time.  Monitor for symptoms.  Fluids if needed.  .  Coronary artery disease No complaints. On Metoprolol    CABG 2012  . Elevated cholesterol Pravastatin  . Asthma     NO PROBLEMS SINCE 2012  . Arthritis   . Neuropathy   . Cancer     SKIN CANCER  . GERD (gastroesophageal reflux disease) Prevacid renewed.  . Constipation PRN meds are ordered  . Diabetes mellitus Metformin on hold postop for now. Serum Glucose 127 this AM    Assessment/Plan: 1 Day Post-Op Procedure(s) (LRB): TOTAL HIP ARTHROPLASTY (Right) Principal Problem:  *OA (osteoarthritis) of knee Active Problems:  Postop Hyponatremia  Advance diet Up with therapy Continue foley due to strict I&O and urinary output monitoring Discharge home with home health  DVT Prophylaxis - Xarelto Partial-Weight Bearing 25-50% right Leg D/C Knee Immobilizer Hemovac Pulled Begin Therapy Hip Preacutions Keep foley until tomorrow. No vaccines. Cards - Dr. Donnie Aho - Patient was seen preoperatively by Dr. Donnie Aho and felt to be stable for surgery. He did want the patient monitored on the telemitry floor postoperatively. He remained on tele last night.  No reported problems.  Will call Dr. Donnie Aho as a courtesy to notify him that patient is in the hospital.  Patrica Duel 05/20/2012, 9:00 AM

## 2012-05-20 NOTE — Care Management Note (Signed)
    Page 1 of 1   05/22/2012     11:27:13 AM   CARE MANAGEMENT NOTE 05/22/2012  Patient:  ANASTASIO, WOGAN   Account Number:  [id number]  Date Initiated:  05/20/2012  Documentation initiated by:  Lanier Clam  Subjective/Objective Assessment:   ADMITTED W/R HIP PAIN.R THA.     Action/Plan:   FROM HOME.HAS ALL DME.   Anticipated DC Date:  05/22/2012   Anticipated DC Plan:  HOME W HOME HEALTH SERVICES      DC Planning Services  CM consult      Choice offered to / List presented to:  C-1 Patient        HH arranged  HH-2 PT      [facility] agency  [facility]   Status of service:  Completed, signed off Medicare Important Message given?   (If response is "NO", the following Medicare IM given date fields will be blank) Date Medicare IM given:   Date Additional Medicare IM given:    Discharge Disposition:  HOME W HOME HEALTH SERVICES  Per UR Regulation:  Reviewed for med. necessity/level of care/duration of stay  If discussed at Long Length of Stay Meetings, dates discussed:    Comments:  05/20/12 Khalen Styer RN,BSN NCM [phone] [facility]ENT[facility]-DEBBIE ([place])ALREADY FOLLOWING FROM OFFICE.PT-HH VS SNF.MD ORDERED HHPT,F2F.

## 2012-05-20 NOTE — Progress Notes (Signed)
Physical Therapy Treatment Patient Details Name: Johnathan Rodriguez MRN: [id number] DOB: December 04, 1945 Today's Date: 05/20/2012 Time: 1202-1216 PT Time Calculation (min): 14 min  PT Assessment / Plan / Recommendation Comments on Treatment Session  Pt requesting assistance back to bed. Agreeable to ambulate prior to returning to bed. Improved tolerance/mobility during pm session and pain control slightly better. Discussed d/c plan-pt feels more comfortable with d/c home on Thursday if MD agreeable. Will see how pt does on tomorrow a.m.     Follow Up Recommendations  Home health PT    Barriers to Discharge Decreased caregiver support wife able to assist but only minimally.     Equipment Recommendations  None recommended by PT    Recommendations for Other Services OT consult  Frequency 7X/week   Plan Discharge plan remains appropriate    Precautions / Restrictions Precautions Precautions: Posterior Hip Precaution Comments: Reviewed total hip precautions and PWB status Restrictions Weight Bearing Restrictions: Yes RLE Weight Bearing: Partial weight bearing RLE Partial Weight Bearing Percentage or Pounds: 25-50%   Pertinent Vitals/Pain     Mobility  Bed Mobility Bed Mobility: Sit to Supine Sit to Supine: 4: Min assist Details for Bed Mobility Assistance: Assist for R LE onto bed. VCs safety, technique.  Transfers Transfers: Sit to Stand;Stand to Sit Sit to Stand: 4: Min guard;With upper extremity assist;With armrests;From chair/3-in-1 Stand to Sit: To bed;4: Min assist;With upper extremity assist;With armrests Details for Transfer Assistance: VCs safety, technique, hand placement. Assist to stabilize and control descent.  Ambulation/Gait Ambulation/Gait Assistance: 4: Min guard Ambulation Distance (Feet): 50 Feet Assistive device: Rolling walker Ambulation/Gait Assistance Details: VCs safety, technique, sequence, adherence to WB status. Able to increase ambulation pm session.  Tolerated activity better as well.  Gait Pattern: Antalgic;Step-to pattern;Decreased step length - right;Decreased step length - left;Decreased stride length    Exercises     PT Diagnosis: Difficulty walking;Abnormality of gait;Acute pain  PT Problem List: Decreased strength;Decreased range of motion;Decreased activity tolerance;Decreased mobility;Pain;Decreased knowledge of precautions;Decreased knowledge of use of DME PT Treatment Interventions: DME instruction;Gait training;Stair training;Functional mobility training;Therapeutic activities;Therapeutic exercise;Patient/family education   PT Goals Acute Rehab PT Goals PT Goal Formulation: With patient Time For Goal Achievement: 05/27/12 Potential to Achieve Goals: Good Pt will go Supine/Side to Sit: with supervision PT Goal: Supine/Side to Sit - Progress: Goal set today Pt will go Sit to Supine/Side: with supervision PT Goal: Sit to Supine/Side - Progress: Progressing toward goal Pt will go Sit to Stand: with supervision PT Goal: Sit to Stand - Progress: Progressing toward goal Pt will go Stand to Sit: with supervision PT Goal: Stand to Sit - Progress: Progressing toward goal Pt will Ambulate: >150 feet;with supervision;with rolling walker PT Goal: Ambulate - Progress: Progressing toward goal Pt will Go Up / Down Stairs: 3-5 stairs;with least restrictive assistive device PT Goal: Up/Down Stairs - Progress: Goal set today  Visit Information  Last PT Received On: 05/20/12 Assistance Needed: +1    Subjective Data  Subjective: "i'm just sore" Patient Stated Goal: Home   Cognition  Overall Cognitive Status: Appears within functional limits for tasks assessed/performed Arousal/Alertness: Awake/alert Orientation Level: Appears intact for tasks assessed Behavior During Session: [facility] for tasks performed    Balance     End of Session PT - End of Session Equipment Utilized During Treatment: Gait belt Activity Tolerance: Patient  limited by pain Patient left: in bed;with call bell/phone within reach;with family/visitor present    [name] 05/20/2012, 12:26 PM [phone]

## 2012-05-21 LAB — CBC
HCT: 34.3 % — ABNORMAL LOW (ref 39.0–52.0)
MCH: 32 pg (ref 26.0–34.0)
MCV: 90.7 fL (ref 78.0–100.0)
RBC: 3.78 MIL/uL — ABNORMAL LOW (ref 4.22–5.81)
WBC: 9.4 10*3/uL (ref 4.0–10.5)

## 2012-05-21 LAB — BASIC METABOLIC PANEL
BUN: 12 mg/dL (ref 6–23)
CO2: 27 mEq/L (ref 19–32)
Calcium: 8.8 mg/dL (ref 8.4–10.5)
Chloride: 94 mEq/L — ABNORMAL LOW (ref 96–112)
Creatinine, Ser: 1.1 mg/dL (ref 0.50–1.35)

## 2012-05-21 LAB — GLUCOSE, CAPILLARY
Glucose-Capillary: 141 mg/dL — ABNORMAL HIGH (ref 70–99)
Glucose-Capillary: 126 mg/dL — ABNORMAL HIGH (ref 70–99)

## 2012-05-21 NOTE — Progress Notes (Signed)
Patient was only able to void 50 cc since foley cath was removed earlier today.  Performed bladder scan which showed 666 cc at 23:10.  Notified MD on call.  Order received to perform I&O cath.  Patient had 800 cc of tea colored urine return when I&O cath performed at 23:55.  Will continue to monitor patient. Manson Passey, Talya Quain Cherie

## 2012-05-21 NOTE — Progress Notes (Signed)
Subjective: 2 Days Post-Op Procedure(s) (LRB): TOTAL HIP ARTHROPLASTY (Right) Patient reports pain as mild.  He is feeling better today as compared to yesterday. Patient seen in rounds with Dr. Lequita Halt. Patient is well, and has had no acute complaints or problems Plan is to go Home after hospital stay.  Objective: Vital signs in last 24 hours: Temp:  [98.2 F (36.8 C)-99.3 F (37.4 C)] 99.3 F (37.4 C) (05/22 1455) Pulse Rate:  [81-99] 81  (05/22 1455) Resp:  [18-20] 20  (05/22 1455) BP: (107-146)/(67-81) 124/71 mmHg (05/22 1455) SpO2:  [93 %-98 %] 98 % (05/22 1455)  Intake/Output from previous day:  Intake/Output Summary (Last 24 hours) at 05/21/12 1949 Last data filed at 05/21/12 1700  Gross per 24 hour  Intake 1100.5 ml  Output   2800 ml  Net -1699.5 ml    Intake/Output this shift:    Labs:  Basename 05/21/12 0430 05/20/12 0443  HGB 12.1* 13.3    Basename 05/21/12 0430 05/20/12 0443  WBC 9.4 9.1  RBC 3.78* 4.19*  HCT 34.3* 38.6*  PLT 205 227    Basename 05/21/12 0430 05/20/12 0443  NA 130* 134*  K 4.0 4.3  CL 94* 96  CO2 27 29  BUN 12 16  CREATININE 1.10 1.13  GLUCOSE 126* 127*  CALCIUM 8.8 8.6   No results found for this basename: LABPT:2,INR:2 in the last 72 hours  EXAM General - Patient is Alert, Appropriate and Oriented Extremity - Neurovascular intact Sensation intact distally Dressing/Incision - clean, dry, no drainage, healing Motor Function - intact, moving foot and toes well on exam.   Past Medical History  Diagnosis Date  . PONV (postoperative nausea and vomiting)   . Myocardial infarction   . Hypertension   . Coronary artery disease     CABG 2012  . Elevated cholesterol   . Asthma     NO PROBLEMS SINCE 2012  . Arthritis   . Neuropathy   . Cancer     SKIN CANCER  . GERD (gastroesophageal reflux disease)   . Constipation   . Diabetes mellitus     Assessment/Plan: 2 Days Post-Op Procedure(s) (LRB): TOTAL HIP ARTHROPLASTY  (Right) Principal Problem:  *OA (osteoarthritis) of hip Active Problems:  Postop Hyponatremia   Up with therapy Plan for discharge tomorrow Discharge home with home health  DVT Prophylaxis - Xarelto Partial-Weight Bearing 25-50% right Leg Cards - Dr. Donnie Aho - Patient was seen preoperatively by Dr. Donnie Aho and felt to be stable for surgery. He did want the patient monitored on the telemitry floor postoperatively. He remained on tele since surgery. No reported problems. I spoke with Dr. Donnie Aho as a courtesy to notify him that patient is in the hospital and ask him about telemetry.  He recommended keeping him on tele for at least 48 hours following surgery.  Will remain on tele for now. He is planning on going home tomorrow so we will keep him on tele floor for now. Reevaluate in the morning.   Patrica Duel 05/21/2012, 7:49 PM

## 2012-05-21 NOTE — Evaluation (Signed)
Occupational Therapy Evaluation Patient Details Name: Johnathan Rodriguez MRN: [id number] DOB: [birth year] Today's Date: 05/21/2012 Time: 4098-1191 OT Time Calculation (min): 34 min  OT Assessment / Plan / Recommendation Clinical Impression  Pt is a 67 year old man s/p R THA with posterior precautions.  Pt needs further education in hip precautions related to ADL and practice with shower transfer.  Will follow acutely.  Do not anticipate further OT needs upon d/c.    OT Assessment  Patient needs continued OT Services    Follow Up Recommendations  Supervision/Assistance - 24 hour          Equipment Recommendations  None recommended by PT    Recommendations for Other Services    Frequency  Min 2X/week    Precautions / Restrictions Precautions Precautions: Fall;Posterior Hip Precaution Booklet Issued: Yes (comment) Precaution Comments: Pt able to recall 3/3 hip precautions and WB status with prompting. Issued handout.  Restrictions Weight Bearing Restrictions: Yes RLE Weight Bearing: Partial weight bearing RLE Partial Weight Bearing Percentage or Pounds: 25-50%   Pertinent Vitals/Pain     ADL  Eating/Feeding: Simulated;Independent Where Assessed - Eating/Feeding: Edge of bed Grooming: Simulated;Set up Where Assessed - Grooming: Unsupported sitting Upper Body Bathing: Simulated;Set up Where Assessed - Upper Body Bathing: Unsupported sitting Lower Body Bathing: Simulated;Maximal assistance Where Assessed - Lower Body Bathing: Unsupported sitting;Unsupported standing Upper Body Dressing: Performed;Minimal assistance;Other (comment) (due to IV line) Where Assessed - Upper Body Dressing: Unsupported sitting Lower Body Dressing: Simulated;Maximal assistance Where Assessed - Lower Body Dressing: Unsupported sitting;Sopported sit to stand Toilet Transfer: Mining engineer Method: Other (comment) (ambulating) Equipment Used: Reacher;Sock aid;Rolling  walker;Gait belt;Long-handled shoe horn;Long-handled sponge Transfers/Ambulation Related to ADLs: Min guard assist for ambulation with RW. ADL Comments: Instructed pt in hip precautions related to LB ADL.  Pt to have wife purchase hip kit.  Discussed option to use 3 in 1 in shower as opposed to built in seats.      OT Diagnosis: Generalized weakness;Acute pain  OT Problem List: Impaired balance (sitting and/or standing);Decreased knowledge of use of DME or AE;Pain;Decreased knowledge of precautions OT Treatment Interventions: Self-care/ADL training;DME and/or AE instruction;Patient/family education   OT Goals Acute Rehab OT Goals OT Goal Formulation: With patient Time For Goal Achievement: 05/28/12 Potential to Achieve Goals: Good ADL Goals Pt Will Perform Grooming: with supervision;Standing at sink ADL Goal: Grooming - Progress: Goal set today Pt Will Perform Lower Body Bathing: with supervision;Sitting, edge of bed;Sit to stand from bed;with adaptive equipment ADL Goal: Lower Body Bathing - Progress: Goal set today Pt Will Perform Lower Body Dressing: with supervision;with adaptive equipment;Sit to stand from bed ADL Goal: Lower Body Dressing - Progress: Goal set today Pt Will Perform Tub/Shower Transfer: Shower transfer;with supervision;with DME;Ambulation;Grab bars;Maintaining hip precautions;Maintaining weight bearing status ADL Goal: Tub/Shower Transfer - Progress: Goal set today  Visit Information  Last OT Received On: 05/21/12 Assistance Needed: +1    Subjective Data  Subjective: "I will pay more in gas than if I just buy that equipment here." Patient Stated Goal: Home with wife.   Prior Functioning  Home Living Lives With: Spouse Available Help at Discharge: Family Type of Home: House Home Access: Stairs to enter [employer] of Steps: 5 + 3 +3  Entrance Stairs-Rails: None Home Layout: One level;Able to live on main level with bedroom/bathroom Bathroom  Shower/Tub: Health visitor: Standard Bathroom Accessibility: Yes How Accessible: Accessible via walker Home Adaptive Equipment: Built-in shower seat;Walker - rolling;Crutches;Bedside commode/3-in-1;Grab bars  in shower Prior Function Level of Independence: Independent Able to Take Stairs?: Yes Driving: Yes Vocation: Retired Musician: No difficulties Dominant Hand: Right    Cognition  Overall Cognitive Status: Appears within functional limits for tasks assessed/performed Arousal/Alertness: Awake/alert Orientation Level: Appears intact for tasks assessed Behavior During Session: [facility] for tasks performed    Extremity/Trunk Assessment Right Upper Extremity Assessment RUE ROM/Strength/Tone: Within functional levels RUE Coordination: WFL - gross/fine motor Left Upper Extremity Assessment LUE ROM/Strength/Tone: Within functional levels LUE Coordination: WFL - gross/fine motor   Mobility Bed Mobility Bed Mobility: Supine to Sit;Sit to Supine Supine to Sit: 3: Mod assist Sit to Supine: 4: Min assist Details for Bed Mobility Assistance: Difficulty raising trunk and R LE onto/off bed.  Transfers Transfers: Sit to Stand;Stand to Sit Sit to Stand: 4: Min guard;From elevated surface;With upper extremity assist;From bed Stand to Sit: 4: Min guard;To bed;With upper extremity assist Details for Transfer Assistance: VCs safety, technique, hand placement, to extend R LE before sitting/standing.           End of Session OT - End of Session Equipment Utilized During Treatment: Gait belt Activity Tolerance: Patient tolerated treatment well Patient left: in bed;with call bell/phone within reach   Evern Bio 05/21/2012, 11:27 AM [phone]

## 2012-05-21 NOTE — Progress Notes (Signed)
Physical Therapy Treatment Patient Details Name: Johnathan Rodriguez MRN: [id number] DOB: 11-Jun-1945 Today's Date: 05/21/2012 Time: 4098-1191 PT Time Calculation (min): 23 min  PT Assessment / Plan / Recommendation Comments on Treatment Session  Progressing slowly. Still somewhat limited by pain and activity tolerance. Possible d/c home tomorrow. Will need to practice steps    Follow Up Recommendations  Home health PT    Barriers to Discharge        Equipment Recommendations  None recommended by PT    Recommendations for Other Services    Frequency 7X/week   Plan Discharge plan remains appropriate    Precautions / Restrictions Precautions Precautions: Fall;Posterior Hip Precaution Booklet Issued: Yes (comment) Restrictions Weight Bearing Restrictions: Yes RLE Weight Bearing: Partial weight bearing RLE Partial Weight Bearing Percentage or Pounds: 25-50%   Pertinent Vitals/Pain     Mobility  Bed Mobility Bed Mobility: Supine to Sit Supine to Sit: HOB elevated;4: Min assist Sit to Supine: 4: Min assist Details for Bed Mobility Assistance: Increased time and difficulty with trunk to upright (effortful for pt). VCs safety, technique, hand placement. Practiced without use of handrail and encouraged pt to adhere to hip precaution during  bed mobility as well. assist for LE onto bed and trunk to upright.  Transfers Transfers: Sit to Stand;Stand to Sit Sit to Stand: 4: Min guard;From elevated surface;From bed Stand to Sit: To bed;With upper extremity assist;4: Min guard Details for Transfer Assistance: VCs safety, hand placement.  Ambulation/Gait Ambulation/Gait Assistance: 4: Min guard Ambulation Distance (Feet): 75 Feet Assistive device: Rolling walker Ambulation/Gait Assistance Details: VCS safety, adherence to WB status. Continues to fatigue somewhat easily.  Gait Pattern: Step-to pattern;Decreased stride length;Decreased step length - right;Decreased step length -  left;Antalgic    Exercises     PT Goals Acute Rehab PT Goals PT Goal: Supine/Side to Sit - Progress: Progressing toward goal PT Goal: Sit to Supine/Side - Progress: Progressing toward goal PT Goal: Sit to Stand - Progress: Progressing toward goal PT Goal: Stand to Sit - Progress: Progressing toward goal PT Goal: Ambulate - Progress: Progressing toward goal  Visit Information  Last PT Received On: 05/21/12 Assistance Needed: +1    Subjective Data  Subjective: "I'm wore out" Patient Stated Goal: Less sore.    Cognition  Overall Cognitive Status: Appears within functional limits for tasks assessed/performed Arousal/Alertness: Awake/alert Orientation Level: Appears intact for tasks assessed Behavior During Session: [facility] for tasks performed    Balance     End of Session PT - End of Session Equipment Utilized During Treatment: Gait belt Activity Tolerance: Patient limited by pain;Patient limited by fatigue Patient left: in bed;with call bell/phone within reach    [name] Alert [facility] 05/21/2012, 2:45 PM [phone]

## 2012-05-21 NOTE — Progress Notes (Signed)
Patient was able to void 200 cc of urine this a.m.  Will continue to monitor patient. Manson Passey, Nereida Schepp Cherie

## 2012-05-21 NOTE — Progress Notes (Signed)
Physical Therapy Treatment Patient Details Name: Johnathan Rodriguez MRN: [id number] DOB: [birth year] Today's Date: 05/21/2012 Time: 7829-5621 PT Time Calculation (min): 18 min  PT Assessment / Plan / Recommendation Comments on Treatment Session  Pt declined to sit up in chair after session. Able to increase distance this session. Pt still reporting significant discomfort "soreness" R hip but able to participate fairly well. Possible d/c home tomorrow.     Follow Up Recommendations  Home health PT    Barriers to Discharge        Equipment Recommendations  None recommended by PT    Recommendations for Other Services    Frequency 7X/week   Plan Discharge plan remains appropriate    Precautions / Restrictions Precautions Precautions: Posterior Hip Precaution Booklet Issued: Yes (comment) Precaution Comments: Pt able to recall 3/3 hip precautions and WB status with prompting. Issued handout.  Restrictions Weight Bearing Restrictions: Yes RLE Weight Bearing: Partial weight bearing RLE Partial Weight Bearing Percentage or Pounds: 25-50%   Pertinent Vitals/Pain     Mobility  Bed Mobility Bed Mobility: Supine to Sit;Sit to Supine Supine to Sit: 3: Mod assist Sit to Supine: 4: Min assist Details for Bed Mobility Assistance: Pt continues to have increased difficulty with supine>sit. Difficulty raising trunk to upright without assist for trunk (pt pulls up on therapist'shand) and R LE onto/off bed.  Transfers Transfers: Sit to Stand;Stand to Sit Sit to Stand: 4: Min guard;From elevated surface;With upper extremity assist;From bed Stand to Sit: 4: Min guard;To bed;With upper extremity assist Details for Transfer Assistance: VCs safety, technique, hand placement, to extend R LE before sitting/standing.  Ambulation/Gait Ambulation/Gait Assistance: 4: Min guard Ambulation Distance (Feet): 75 Feet Assistive device: Rolling walker Ambulation/Gait Assistance Details: VCs safety, sequence,  adherence to WB status. Slowly progressing distance. Fatigues somewhat easily.  Gait Pattern: Step-to pattern;Decreased stride length;Decreased step length - right;Decreased step length - left;Antalgic    Exercises Total Joint Exercises Ankle Circles/Pumps: AROM;Both;10 reps;Supine Quad Sets: AROM;Both;10 reps;Supine Short Arc Quad: AROM;Right;10 reps;Supine Heel Slides: AAROM;Right;10 reps;Supine Hip ABduction/ADduction: AAROM;Right;10 reps;Supine   PT Diagnosis:    PT Problem List:   PT Treatment Interventions:     PT Goals Acute Rehab PT Goals PT Goal: Supine/Side to Sit - Progress: Progressing toward goal PT Goal: Sit to Supine/Side - Progress: Progressing toward goal PT Goal: Sit to Stand - Progress: Progressing toward goal PT Goal: Stand to Sit - Progress: Progressing toward goal PT Goal: Ambulate - Progress: Progressing toward goal  Visit Information  Last PT Received On: 05/21/12 Assistance Needed: +1    Subjective Data  Subjective: "It's just this soreness...." Patient Stated Goal: Move better. Home   Cognition  Overall Cognitive Status: Appears within functional limits for tasks assessed/performed Arousal/Alertness: Awake/alert Orientation Level: Appears intact for tasks assessed Behavior During Session: [facility] for tasks performed    Balance     End of Session PT - End of Session Equipment Utilized During Treatment: Gait belt Activity Tolerance: Patient limited by pain;Patient limited by fatigue Patient left: in bed;with call bell/phone within reach    [name] Alert [facility] 05/21/2012, 11:14 AM [phone]

## 2012-05-22 LAB — BASIC METABOLIC PANEL
CO2: 29 mEq/L (ref 19–32)
Chloride: 92 mEq/L — ABNORMAL LOW (ref 96–112)
Creatinine, Ser: 1.01 mg/dL (ref 0.50–1.35)
Potassium: 3.7 mEq/L (ref 3.5–5.1)
Sodium: 130 mEq/L — ABNORMAL LOW (ref 135–145)

## 2012-05-22 LAB — GLUCOSE, CAPILLARY: Glucose-Capillary: 122 mg/dL — ABNORMAL HIGH (ref 70–99)

## 2012-05-22 LAB — CBC
MCV: 90.3 fL (ref 78.0–100.0)
Platelets: 180 10*3/uL (ref 150–400)
RBC: 3.49 MIL/uL — ABNORMAL LOW (ref 4.22–5.81)
WBC: 8.4 10*3/uL (ref 4.0–10.5)

## 2012-05-22 MED ORDER — METHOCARBAMOL 500 MG PO TABS: 500.0000 mg | ORAL_TABLET | Freq: Four times a day (QID) | ORAL | Status: AC | PRN

## 2012-05-22 MED ORDER — RIVAROXABAN 10 MG PO TABS: 10.0000 mg | ORAL_TABLET | Freq: Every day | ORAL | Status: DC

## 2012-05-22 MED ORDER — OXYCODONE HCL 5 MG PO TABS: 5.0000 mg | ORAL_TABLET | ORAL | Status: AC | PRN

## 2012-05-22 NOTE — Discharge Instructions (Signed)
Dr. Ollen Gross Total Joint Specialist [facility] [address] [place], [place] [address] [phone]   TOTAL HIP REPLACEMENT POSTOPERATIVE DIRECTIONS    Hip Rehabilitation, Guidelines Following Surgery  The results of a hip operation are greatly improved after range of motion and muscle strengthening exercises. Follow all safety measures which are given to protect your hip. If any of these exercises cause increased pain or swelling in your joint, decrease the amount until you are comfortable again. Then slowly increase the exercises. Call your caregiver if you have problems or questions.  HOME CARE INSTRUCTIONS  Most of the following instructions are designed to prevent the dislocation of your new hip.  Remove items at home which could result in a fall. This includes throw rugs or furniture in walking pathways.  Continue medications as instructed at time of discharge.  You may have some home medications which will be placed on hold until you complete the course of blood thinner medication.  You may start showering once you are discharged home but do not submerge the incision under water. Do not put on socks or shoes without following the instructions of your caregivers.  Sit on high chairs so your hips are not bent more than 90 degrees.  Sit on chairs with arms. Use the chair arms to help push yourself up when arising.  Keep your leg on the side of the operation out in front of you when standing up.  Arrange for the use of a toilet seat elevator so you are not sitting low.  Do not do any exercises or get in any positions that cause your toes to point in (pigeon toed).  Always sleep with a pillow between your legs. Do not lie on your side in sleep with both knees touching the bed.   Walk with walker as instructed.  You may resume a sexual relationship in one month or when given the OK by your caregiver.  Use walker as long as suggested by your  caregivers.  Begin partial weight bearing after surgery but do not advance weight bearing status until approved by your surgeon.  Avoid periods of inactivity such as sitting longer than an hour when not asleep. This helps prevent blood clots.  You may return to work once you are cleared by Designer, industrial/product.  Do not drive a car for 6 weeks or until released by your surgeon.  Do not drive while taking narcotics.  Wear elastic stockings for three weeks following surgery during the day but you may remove then at night.  Make sure you keep all of your appointments after your operation with all of your doctors and caregivers.  Change the dressing daily and reapply a dry dressing each time. Please pick up a stool softener and laxative for home use as long as you are requiring pain medications. Continue to use ice on the knee for pain and swelling from surgery. It is important for you to complete the blood thinner medication as prescribed by your doctor.  RANGE OF MOTION AND STRENGTHENING EXERCISES  These exercises are designed to help you keep full movement of your hip joint. Follow your caregiver's or physical therapist's instructions. Perform all exercises about fifteen times, three times per day or as directed. Exercise both hips, even if you have had only one joint replacement. These exercises can be done on a training (exercise) mat, on the floor, on a table or on a bed. Use whatever works the best and is most comfortable  for you. Use music or television while you are exercising so that the exercises are a pleasant break in your day. This will make your life better with the exercises acting as a break in routine you can look forward to.  Lying on your back, slowly slide your foot toward your buttocks, raising your knee up off the floor. Then slowly slide your foot back down until your leg is straight again.  Lying on your back spread your legs as far apart as you can without causing discomfort.  Lying on  your side, raise your upper leg and foot straight up from the floor as far as is comfortable. Slowly lower the leg and repeat.  Lying on your back, tighten up the muscle in the front of your thigh (quadriceps muscles). You can do this by keeping your leg straight and trying to raise your heel off the floor. This helps strengthen the largest muscle supporting your knee.  Lying on your back, tighten up the muscles of your buttocks both with the legs straight and with the knee bent at a comfortable angle while keeping your heel on the floor.   SKILLED REHAB INSTRUCTIONS: If the patient is transferred to a skilled rehab facility following release from the hospital, a list of the current medications will be sent to the facility for the patient to continue.  When discharged from the skilled rehab facility, please have the facility set up the patient's Home Health Physical Therapy prior to being released. Also, the skilled facility will be responsible for providing the patient with their medications at time of release from the facility to include their pain medication, the muscle relaxants, and their blood thinner medication. If the patient is still at the rehab facility at time of the two week follow up appointment, the skilled rehab facility will also need to assist the patient in arranging follow up appointment in our office and any transportation needs.  MAKE SURE YOU:  Understand these instructions.  Will watch your condition.  Will get help right away if you are not doing well or get worse.  Document Released: 07/20/2004 Document Revised: 12/06/2011 Document Reviewed: 07/07/2008  [facility] Patient Information 2012 [place], [place].  Take for two and a half more weeks, then discontinue Xarelto. Once the patient has completed the Xarelto, they may resume the 81 mg Aspirin.

## 2012-05-22 NOTE — Progress Notes (Signed)
Subjective: 3 Days Post-Op Procedure(s) (LRB): TOTAL HIP ARTHROPLASTY (Right) Patient reports pain as mild.   Patient seen in rounds with Dr. Lequita Halt. Patient is well, and has had no acute complaints or problems Patient is ready to go home.  Objective: Vital signs in last 24 hours: Temp:  [98.3 F (36.8 C)-99.3 F (37.4 C)] 99.1 F (37.3 C) (05/23 0519) Pulse Rate:  [75-89] 89  (05/23 0910) Resp:  [18-20] 18  (05/23 0519) BP: (124-143)/(71-81) 142/81 mmHg (05/23 0910) SpO2:  [96 %-98 %] 96 % (05/23 0519)  Intake/Output from previous day:  Intake/Output Summary (Last 24 hours) at 05/22/12 1058 Last data filed at 05/22/12 0520  Gross per 24 hour  Intake  970.5 ml  Output   2525 ml  Net -1554.5 ml    Intake/Output this shift:    Labs:  Basename 05/22/12 0445 05/21/12 0430 05/20/12 0443  HGB 11.1* 12.1* 13.3    Basename 05/22/12 0445 05/21/12 0430  WBC 8.4 9.4  RBC 3.49* 3.78*  HCT 31.5* 34.3*  PLT 180 205    Basename 05/22/12 0445 05/21/12 0430  NA 130* 130*  K 3.7 4.0  CL 92* 94*  CO2 29 27  BUN 12 12  CREATININE 1.01 1.10  GLUCOSE 134* 126*  CALCIUM 8.6 8.8   No results found for this basename: LABPT:2,INR:2 in the last 72 hours  EXAM: General - Patient is Alert, Appropriate and Oriented Extremity - Neurovascular intact Sensation intact distally Incision - clean, dry, no drainage, healing Motor Function - intact, moving foot and toes well on exam.   Assessment/Plan: 3 Days Post-Op Procedure(s) (LRB): TOTAL HIP ARTHROPLASTY (Right) Procedure(s) (LRB): TOTAL HIP ARTHROPLASTY (Right) Past Medical History  Diagnosis Date  . PONV (postoperative nausea and vomiting)   . Myocardial infarction   . Hypertension   . Coronary artery disease     CABG 2012  . Elevated cholesterol   . Asthma     NO PROBLEMS SINCE 2012  . Arthritis   . Neuropathy   . Cancer     SKIN CANCER  . GERD (gastroesophageal reflux disease)   . Constipation   . Diabetes  mellitus    Principal Problem:  *OA (osteoarthritis) of hip Active Problems:  Postop Hyponatremia   Discharge home with home health Diet - Cardiac diet and Diabetic diet Follow up - in 2 weeks Activity - PWB Disposition - Home Condition Upon Discharge - Good D/C Meds - See DC Summary DVT Prophylaxis - Xarelto  Johnathan Rodriguez 05/22/2012, 10:58 AM

## 2012-05-22 NOTE — Progress Notes (Signed)
Physical Therapy Treatment Patient Details Name: Johnathan Rodriguez MRN: [id number] DOB: 1945-10-24 Today's Date: 05/22/2012 Time: 0927-1002 PT Time Calculation (min): 35 min  PT Assessment / Plan / Recommendation Comments on Treatment Session  DC home today. Reviewed/discussed car transfer. Practiced steps. Pt and wife felt comfortable. Pt will also have additional help to get into home. No further questions/concerns from pt/wife.     Follow Up Recommendations  Home health PT    Barriers to Discharge        Equipment Recommendations  None recommended by PT    Recommendations for Other Services    Frequency     Plan Discharge plan remains appropriate    Precautions / Restrictions Precautions Precautions: Fall;Posterior Hip Precaution Booklet Issued: Yes (comment) Precaution Comments: Pt able to recall 3/3 hip precautions and WB status with prompting. (OT reviewed prior to PT session)  Restrictions Weight Bearing Restrictions: Yes RLE Weight Bearing: Partial weight bearing RLE Partial Weight Bearing Percentage or Pounds: 25-50%   Pertinent Vitals/Pain     Mobility  Bed Mobility Bed Mobility: Sit to Supine Supine to Sit: 5: Supervision;HOB flat Sit to Supine: 4: Min assist Details for Bed Mobility Assistance: Assist for R LE onto bed. VCs safety.  Transfers Transfers: Sit to Stand;Stand to Sit Sit to Stand: 5: Supervision Stand to Sit: 5: Supervision Details for Transfer Assistance: VCs safety, hand placement.  Ambulation/Gait Ambulation/Gait Assistance: 4: Min guard Ambulation Distance (Feet): 80 Feet Assistive device: Rolling walker Ambulation/Gait Assistance Details: VCs safety, adherence to WB status.  Stairs: Yes Stairs Assistance: 4: Min assist Stairs Assistance Details (indicate cue type and reason): VCS safety, technique. Wife present. Demonstrated and practiced. Pt has 5+3+3 with landing in between each set of steps. Discussed how to perform 2nd and 3rd set.    Stair Management Technique: Backwards;With walker;Step to pattern Number of Stairs: 4     Exercises     PT Diagnosis:    PT Problem List:   PT Treatment Interventions:     PT Goals Acute Rehab PT Goals PT Goal: Sit to Supine/Side - Progress: Progressing toward goal PT Goal: Sit to Stand - Progress: Progressing toward goal PT Goal: Ambulate - Progress: Progressing toward goal PT Goal: Up/Down Stairs - Progress: Progressing toward goal  Visit Information  Last PT Received On: 05/22/12 Assistance Needed: +1    Subjective Data  Subjective: "Yeah, I'm headed out today" Patient Stated Goal: Home   Cognition  Overall Cognitive Status: Appears within functional limits for tasks assessed/performed Arousal/Alertness: Awake/alert Orientation Level: Appears intact for tasks assessed Behavior During Session: [facility] for tasks performed    Balance     End of Session PT - End of Session Equipment Utilized During Treatment: Gait belt Activity Tolerance: Patient tolerated treatment well Patient left: in bed;with call bell/phone within reach;with family/visitor present    [name] Alert [facility] 05/22/2012, 10:07 AM [phone]

## 2012-05-22 NOTE — Progress Notes (Signed)
Occupational Therapy Treatment Patient Details Name: Johnathan Rodriguez MRN: [id number] DOB: 1945-05-25 Today's Date: 05/22/2012 Time: 1191-4782 OT Time Calculation (min): 23 min  OT Assessment / Plan / Recommendation Comments on Treatment Session Pt is able to state and generalize hip precautions and WB status in ADL.    Follow Up Recommendations  Supervision/Assistance - 24 hour    Barriers to Discharge       Equipment Recommendations  None recommended by OT    Recommendations for Other Services    Frequency     Plan Discharge plan remains appropriate    Precautions / Restrictions Precautions Precautions: Fall;Posterior Hip Precaution Booklet Issued: Yes (comment) Precaution Comments: Pt able to recall 3/3 hip precautions and WB status with prompting. Issued handout.  Restrictions Weight Bearing Restrictions: Yes RLE Weight Bearing: Partial weight bearing RLE Partial Weight Bearing Percentage or Pounds: 25-50%   Pertinent Vitals/Pain Soreness in R hip    ADL  Grooming: Performed;Supervision/safety;Teeth care;Wash/dry hands;Wash/dry face Where Assessed - Grooming: Unsupported standing Lower Body Bathing: Simulated;Other (comment) (reviewed use of long handled sponge) Where Assessed - Lower Body Bathing: Unsupported sitting Lower Body Dressing: Simulated;Minimal assistance;Other (comment) (reviewed use of AE) Where Assessed - Lower Body Dressing: Unsupported sitting;Unsupported sit to stand Toilet Transfer: Research scientist (life sciences) Method: Other (comment) (ambulating) Toilet Transfer Equipment: Raised toilet seat with arms (or 3-in-1 over toilet) Toileting - Clothing Manipulation and Hygiene: Performed;Supervision/safety Where Assessed - Engineer, mining and Hygiene: Standing Tub/Shower Transfer: Other (comment) (pt declined practice, verbally educated in method) Web designer Method: Ambulating Equipment Used: Reacher;Sock  aid;Rolling walker;Gait belt;Long-handled shoe horn;Long-handled sponge Transfers/Ambulation Related to ADLs: supervision with RW ADL Comments: Instructed pt in other scenarios in which hip precautions may be challenged. (pt to practice shower transfer with HHPT)    OT Diagnosis:    OT Problem List:   OT Treatment Interventions:     OT Goals ADL Goals Pt Will Perform Grooming: with supervision;Standing at sink ADL Goal: Grooming - Progress: Met Pt Will Perform Lower Body Bathing: with supervision;Sitting, edge of bed;Sit to stand from bed;with adaptive equipment ADL Goal: Lower Body Bathing - Progress: Partly met Pt Will Perform Lower Body Dressing: with supervision;with adaptive equipment;Sit to stand from bed ADL Goal: Lower Body Dressing - Progress: Partly met Pt Will Perform Tub/Shower Transfer: Shower transfer;with supervision;with DME;Ambulation;Grab bars;Maintaining hip precautions;Maintaining weight bearing status ADL Goal: Tub/Shower Transfer - Progress: Partly met  Visit Information  Last OT Received On: 05/22/12 Assistance Needed: +1    Subjective Data      Prior Functioning       Cognition  Overall Cognitive Status: Appears within functional limits for tasks assessed/performed Arousal/Alertness: Awake/alert Orientation Level: Appears intact for tasks assessed Behavior During Session: [facility] for tasks performed    Mobility Bed Mobility Bed Mobility: Supine to Sit Supine to Sit: 5: Supervision;HOB flat Transfers Transfers: Sit to Stand;Stand to Sit Sit to Stand: 4: Min guard;From bed;From elevated surface Stand to Sit: 5: Supervision;To chair/3-in-1;Without upper extremity assist   Exercises    Balance    End of Session OT - End of Session Activity Tolerance: Patient tolerated treatment well Patient left: with call bell/phone within reach;in chair;with nursing in room   Evern Bio 05/22/2012, 9:19 AM [phone]

## 2012-05-22 NOTE — Discharge Summary (Signed)
Physician Discharge Summary   Patient ID: Johnathan Rodriguez MRN: [id number] DOB/AGE: 1945/10/29 67 y.o.  Admit date: 05/19/2012 Discharge date: 05/22/2012  Primary Diagnosis: Osteoarthritis Right Hip  Admission Diagnoses:  Past Medical History  Diagnosis Date  . PONV (postoperative nausea and vomiting)   . Myocardial infarction   . Hypertension   . Coronary artery disease     CABG 2012  . Elevated cholesterol   . Asthma     NO PROBLEMS SINCE 2012  . Arthritis   . Neuropathy   . Cancer     SKIN CANCER  . GERD (gastroesophageal reflux disease)   . Constipation   . Diabetes mellitus    Discharge Diagnoses:   Principal Problem:  *OA (osteoarthritis) of hip Active Problems:  Postop Hyponatremia  Procedure: Procedure(s) (LRB): TOTAL HIP ARTHROPLASTY (Right)   Consults: None (however I did consult with Dr. Donnie Aho by phone concerning patient's telemetry monitoring).  HPI: Johnathan Rodriguez is a 67 y.o. male with end stage arthritis of his right hip with progressively worsening pain and dysfunction. Pain occurs with activity and rest including pain at night. He has tried analgesics, protected weight bearing and rest without benefit. Pain is too severe to attempt physical therapy. Radiographs demonstrate bone on bone arthritis with subchondral cyst formation. He presents now for right THA.  Laboratory Data: Hospital Outpatient Visit on 05/08/2012  Component Date Value Range Status  . MRSA, PCR  05/08/2012 NEGATIVE  NEGATIVE Final  . Staphylococcus aureus  05/08/2012 POSITIVE* NEGATIVE Final   Comment:                                 The Xpert SA Assay (FDA                          approved for NASAL specimens                          only), is one component of                          a comprehensive surveillance                          program.  It is not intended                          to diagnose infection nor to                          guide or monitor treatment.  Marland Kitchen  aPTT (seconds) 05/08/2012 29  24-37 Final  . WBC (K/uL) 05/08/2012 6.9  4.0-10.5 Final  . RBC (MIL/uL) 05/08/2012 4.56  4.22-5.81 Final  . Hemoglobin (g/dL) 78/29/5621 30.8  65.7-84.6 Final  . HCT (%) 05/08/2012 41.4  39.0-52.0 Final  . MCV (fL) 05/08/2012 90.8  78.0-100.0 Final  . MCH (pg) 05/08/2012 32.2  26.0-34.0 Final  . MCHC (g/dL) 96/29/5284 13.2  44.0-10.2 Final  . RDW (%) 05/08/2012 12.1  11.5-15.5 Final  . Platelets (K/uL) 05/08/2012 256  150-400 Final  . Sodium (mEq/L) 05/08/2012 135  135-145 Final  . Potassium (mEq/L) 05/08/2012 3.9  3.5-5.1 Final  . Chloride (mEq/L) 05/08/2012 98  96-112 Final  . CO2 (mEq/L)  05/08/2012 25  19-32 Final  . Glucose, Bld (mg/dL) 16/10/9603 79  54-09 Final  . BUN (mg/dL) 81/19/1478 20  2-95 Final  . Creatinine, Ser (mg/dL) 62/13/0865 7.84  6.96-2.95 Final  . Calcium (mg/dL) 28/41/3244 9.3  0.1-02.7 Final  . Total Protein (g/dL) 25/36/6440 7.7  3.4-7.4 Final  . Albumin (g/dL) 25/95/6387 4.3  5.6-4.3 Final  . AST (U/L) 05/08/2012 26  0-37 Final  . ALT (U/L) 05/08/2012 29  0-53 Final  . Alkaline Phosphatase (U/L) 05/08/2012 53  39-117 Final  . Total Bilirubin (mg/dL) 32/95/1884 0.5  1.6-6.0 Final  . GFR calc non Af Amer (mL/min) 05/08/2012 63* >90 Final  . GFR calc Af Amer (mL/min) 05/08/2012 73* >90 Final   Comment:                                 The eGFR has been calculated                          using the CKD EPI equation.                          This calculation has not been                          validated in all clinical                          situations.                          eGFR's persistently                          <90 mL/min signify                          possible Chronic Kidney Disease.  Marland Kitchen Prothrombin Time (seconds) 05/08/2012 13.6  11.6-15.2 Final  . INR  05/08/2012 1.02  0.00-1.49 Final  . Color, Urine  05/08/2012 YELLOW  YELLOW Final  . APPearance  05/08/2012 CLEAR  CLEAR Final  . Specific Gravity, Urine   05/08/2012 1.025  1.005-1.030 Final  . pH  05/08/2012 6.0  5.0-8.0 Final  . Glucose, UA (mg/dL) 63/12/6008 932* NEGATIVE Final  . Hgb urine dipstick  05/08/2012 NEGATIVE  NEGATIVE Final  . Bilirubin Urine  05/08/2012 NEGATIVE  NEGATIVE Final  . Ketones, ur (mg/dL) 35/57/3220 NEGATIVE  NEGATIVE Final  . Protein, ur (mg/dL) 25/42/7062 NEGATIVE  NEGATIVE Final  . Urobilinogen, UA (mg/dL) 37/62/8315 0.2  1.7-6.1 Final  . Nitrite  05/08/2012 NEGATIVE  NEGATIVE Final  . Leukocytes, UA  05/08/2012 NEGATIVE  NEGATIVE Final   MICROSCOPIC NOT DONE ON URINES WITH NEGATIVE PROTEIN, BLOOD, LEUKOCYTES, NITRITE, OR GLUCOSE <1000 mg/dL.    Basename 05/22/12 0445 05/21/12 0430 05/20/12 0443  HGB 11.1* 12.1* 13.3    Basename 05/22/12 0445 05/21/12 0430  WBC 8.4 9.4  RBC 3.49* 3.78*  HCT 31.5* 34.3*  PLT 180 205    Basename 05/22/12 0445 05/21/12 0430  NA 130* 130*  K 3.7 4.0  CL 92* 94*  CO2 29 27  BUN 12 12  CREATININE 1.01 1.10  GLUCOSE 134* 126*  CALCIUM 8.6 8.8   No results  found for this basename: LABPT:2,INR:2 in the last 72 hours  X-Rays:Dg Chest 2 View  05/08/2012  *RADIOLOGY REPORT*  Clinical Data: Preop  CHEST - 2 VIEW  Comparison: 01/10/2011  Findings: Cardiomediastinal silhouette is stable.  Status post CABG again noted.  No acute infiltrate or pleural effusion.  No pulmonary edema.  Bony thorax is stable.  IMPRESSION: No active disease.  Status post CABG again noted.  Original Report Authenticated By: Natasha Mead, M.D.   Dg Hip Complete Right  05/08/2012  *RADIOLOGY REPORT*  Clinical Data: Preop  RIGHT HIP - COMPLETE 2+ VIEW  Comparison: None.  Findings: Three views of the right hip submitted.  Degenerative changes are noted with narrowing of superior joint space.  Mild superior acetabular sclerosis.  Mild spurring of superior acetabulum bilaterally. No acute fracture or subluxation.  IMPRESSION: Degenerative changes are noted right hip joint with narrowing of superior joint space and  mild superior acetabular sclerosis.  No acute fracture or subluxation.  Original Report Authenticated By: Natasha Mead, M.D.   Dg Pelvis Portable  05/19/2012  *RADIOLOGY REPORT*  Clinical Data: Postop right total hip replacement.  PORTABLE PELVIS  Comparison: Pelvic CT 01/18/2011.  Findings: Patient is status post right total hip arthroplasty. Hardware appears well positioned.  There is no evidence of acute fracture or dislocation.  A surgical drain overlies the right hip joint.  IMPRESSION: No demonstrated complication following right total hip arthroplasty.  Original Report Authenticated By: Gerrianne Scale, M.D.   Dg Hip Portable 1 View Right  05/19/2012  *RADIOLOGY REPORT*  Clinical Data: Postop right total hip replacement.  PORTABLE RIGHT HIP - 1 VIEW  Comparison: Preoperative right hip radiographs 05/08/2012.  Findings: Patient is status post right total hip arthroplasty. Hardware appears well positioned.  There is no evidence of acute fracture or dislocation.  There is soft tissue emphysema lateral to the right hip.  A surgical drain is in place.  IMPRESSION: No demonstrated complication following right total hip arthroplasty.  Original Report Authenticated By: Gerrianne Scale, M.D.    EKG:No orders found for this or any previous visit.   Hospital Course: Patient was seen preoperatively by Dr. Donnie Aho and felt to be stable for surgery. He did want the patient monitored on the telemitry floor postoperatively. Patient was admitted to [facility] and taken to the OR and underwent the above state procedure without complications.  Patient tolerated the procedure well and was later transferred to the recovery room and then to the telemetry floor for postoperative care and cardiac monitoring.  They were given PO and IV analgesics for pain control following their surgery.  They were given 24 hours of postoperative antibiotics and started on DVT prophylaxis in the form of Xarelto.   PT and OT were  ordered for total hip protocol.  The patient was allowed to be PWB with therapy. Discharge planning was consulted to help with postop disposition and equipment needs.  Patient had a good night on the evening of surgery and started to get up OOB with therapy on day one.  Hemovac drain was pulled without difficulty.  The knee immobilizer was removed and discontinued. He remained on tele since surgery. No reported problems. I spoke with Dr. Donnie Aho as a courtesy to notify him that patient was in the hospital and asked him about telemetry. He recommended keeping him on tele for at least 48 hours following surgery. Remained on tele for now. He was planning on going home the next so we  will kept him on tele floor for now. Reevaluated the next morning. Continued to work with therapy into day two.  Dressing was changed on day two and the incision was healing well.  By day three, the patient had progressed with therapy and meeting their goals. He was doing well with no complaints.  Incision was healing well.  Patient was seen in rounds and was ready to go home.  Discharge Medications: Prior to Admission medications   Medication Sig Start Date End Date Taking? Authorizing Provider  lansoprazole (PREVACID) 30 MG capsule Take 30 mg by mouth daily with breakfast.   Yes Historical Provider, MD  lisinopril (PRINIVIL,ZESTRIL) 2.5 MG tablet Take 2.5 mg by mouth daily with breakfast.   Yes Historical Provider, MD  metoprolol (LOPRESSOR) 50 MG tablet Take 25 mg by mouth 2 (two) times daily.   Yes Historical Provider, MD  pravastatin (PRAVACHOL) 40 MG tablet Take 40 mg by mouth daily.    Yes Historical Provider, MD  metFORMIN (GLUCOPHAGE) 850 MG tablet Take 850 mg by mouth 2 (two) times daily with a meal.    Historical Provider, MD  methocarbamol (ROBAXIN) 500 MG tablet Take 1 tablet (500 mg total) by mouth every 6 (six) hours as needed. 05/22/12 06/01/12  Breyden Jeudy, PA  oxyCODONE (OXY IR/ROXICODONE) 5 MG immediate  release tablet Take 1-2 tablets (5-10 mg total) by mouth every 3 (three) hours as needed. 05/22/12 06/01/12  Arian Mcquitty, PA  pregabalin (LYRICA) 100 MG capsule Take 100 mg by mouth daily with breakfast.    Historical Provider, MD  rivaroxaban (XARELTO) 10 MG TABS tablet Take 1 tablet (10 mg total) by mouth daily with breakfast. Take for two and a half more weeks, then discontinue Xarelto. Once the patient has completed the Xarelto, they may resume the 81 mg Aspirin. 05/22/12   Areej Tayler Julien Girt, PA    Diet: Cardiac diet and Diabetic diet Activity:PWB No bending hip over 90 degrees- A "L" Angle Do not cross legs Do not let foot roll inward When turning these patients a pillow should be placed between the patient's legs to prevent crossing. Patients should have the affected knee fully extended when trying to sit or stand from all surfaces to prevent excessive hip flexion. When ambulating and turning toward the affected side the affected leg should have the toes turned out prior to moving the walker and the rest of patient's body as to prevent internal rotation/ turning in of the leg. Abduction pillows are the most effective way to prevent a patient from not crossing legs or turning toes in at rest. If an abduction pillow is not ordered placing a regular pillow length wise between the patient's legs is also an effective reminder. It is imperative that these precautions be maintained so that the surgical hip does not dislocate. Follow-up:in 2 weeks Disposition - Home Discharged Condition: good   Discharge Orders    Future Orders Please Complete By Expires   Diet - low sodium heart healthy      Diet Carb Modified      Call MD / Call 911      Comments:   If you experience chest pain or shortness of breath, CALL 911 and be transported to the hospital emergency room.  If you develope a fever above 101 F, pus (white drainage) or increased drainage or redness at the wound, or calf pain, call  your surgeon's office.   Discharge instructions      Comments:   Pick up stool  softner and laxative for home. Do not submerge incision under water. May shower. Continue to use ice for pain and swelling from surgery.  Take Xarelto for two and a half more weeks, then discontinue Xarelto. Once the patient has completed the Xarelto, they may resume the 81 mg Aspirin.   Constipation Prevention      Comments:   Drink plenty of fluids.  Prune juice may be helpful.  You may use a stool softener, such as Colace (over the counter) 100 mg twice a day.  Use MiraLax (over the counter) for constipation as needed.   Increase activity slowly as tolerated      Patient may shower      Comments:   You may shower without a dressing once there is no drainage.  Do not wash over the wound.  If drainage remains, do not shower until drainage stops.   Driving restrictions      Comments:   No driving until released by the physician.   Lifting restrictions      Comments:   No lifting until released by the physician.   Do not sit on low chairs, stoools or toilet seats, as it may be difficult to get up from low surfaces      Follow the hip precautions as taught in Physical Therapy      Change dressing      Comments:   You may change your dressing dressing daily with sterile 4 x 4 inch gauze dressing and paper tape.  Do not submerge the incision under water.   TED hose      Comments:   Use stockings (TED hose) for 3 weeks on both leg(s).  You may remove them at night for sleeping.     Medication List  As of 05/22/2012 11:04 AM   STOP taking these medications         aspirin EC 81 MG tablet      FLAXSEED (LINSEED) PO      vitamin B-12 1000 MCG tablet         TAKE these medications         lansoprazole 30 MG capsule   Commonly known as: PREVACID   Take 30 mg by mouth daily with breakfast.      lisinopril 2.5 MG tablet   Commonly known as: PRINIVIL,ZESTRIL   Take 2.5 mg by mouth daily with breakfast.       metFORMIN 850 MG tablet   Commonly known as: GLUCOPHAGE   Take 850 mg by mouth 2 (two) times daily with a meal.      methocarbamol 500 MG tablet   Commonly known as: ROBAXIN   Take 1 tablet (500 mg total) by mouth every 6 (six) hours as needed.      metoprolol 50 MG tablet   Commonly known as: LOPRESSOR   Take 25 mg by mouth 2 (two) times daily.      oxyCODONE 5 MG immediate release tablet   Commonly known as: Oxy IR/ROXICODONE   Take 1-2 tablets (5-10 mg total) by mouth every 3 (three) hours as needed.      pravastatin 40 MG tablet   Commonly known as: PRAVACHOL   Take 40 mg by mouth daily.      pregabalin 100 MG capsule   Commonly known as: LYRICA   Take 100 mg by mouth daily with breakfast.      rivaroxaban 10 MG Tabs tablet   Commonly known as: XARELTO   Take 1  tablet (10 mg total) by mouth daily with breakfast. Take for two and a half more weeks, then discontinue Xarelto. Once the patient has completed the Xarelto, they may resume the 81 mg Aspirin.           Follow-up Information    Follow up with Loanne Drilling, MD. Schedule an appointment as soon as possible for a visit in 2 weeks.   Contact information:   [facility] [address] [place] [address] [phone]          Signed: Patrica Duel 05/22/2012, 11:04 AM

## 2012-06-16 ENCOUNTER — Ambulatory Visit: Payer: Medicare Other | Attending: Orthopedic Surgery | Admitting: Physical Therapy

## 2012-06-16 DIAGNOSIS — M25559 Pain in unspecified hip: Secondary | ICD-10-CM | POA: Insufficient documentation

## 2012-06-16 DIAGNOSIS — IMO0001 Reserved for inherently not codable concepts without codable children: Secondary | ICD-10-CM | POA: Insufficient documentation

## 2012-06-16 DIAGNOSIS — R5381 Other malaise: Secondary | ICD-10-CM | POA: Insufficient documentation

## 2012-06-16 DIAGNOSIS — R269 Unspecified abnormalities of gait and mobility: Secondary | ICD-10-CM | POA: Insufficient documentation

## 2012-06-19 ENCOUNTER — Ambulatory Visit: Payer: Medicare Other | Admitting: Physical Therapy

## 2012-06-23 ENCOUNTER — Ambulatory Visit: Payer: Medicare Other | Admitting: Physical Therapy

## 2012-06-26 ENCOUNTER — Ambulatory Visit: Payer: Medicare Other | Admitting: Physical Therapy

## 2012-06-26 ENCOUNTER — Encounter: Payer: Medicare Other | Admitting: *Deleted

## 2012-06-30 ENCOUNTER — Ambulatory Visit: Payer: Medicare Other | Attending: Orthopedic Surgery | Admitting: Physical Therapy

## 2012-06-30 DIAGNOSIS — R5381 Other malaise: Secondary | ICD-10-CM | POA: Insufficient documentation

## 2012-06-30 DIAGNOSIS — R269 Unspecified abnormalities of gait and mobility: Secondary | ICD-10-CM | POA: Insufficient documentation

## 2012-06-30 DIAGNOSIS — IMO0001 Reserved for inherently not codable concepts without codable children: Secondary | ICD-10-CM | POA: Insufficient documentation

## 2012-06-30 DIAGNOSIS — M25559 Pain in unspecified hip: Secondary | ICD-10-CM | POA: Insufficient documentation

## 2012-07-04 ENCOUNTER — Ambulatory Visit: Payer: Medicare Other | Admitting: Physical Therapy

## 2012-07-07 ENCOUNTER — Ambulatory Visit: Payer: Medicare Other | Admitting: Physical Therapy

## 2012-07-11 ENCOUNTER — Ambulatory Visit: Payer: Medicare Other | Admitting: Physical Therapy

## 2012-07-15 ENCOUNTER — Ambulatory Visit: Payer: Medicare Other | Admitting: *Deleted

## 2012-07-17 ENCOUNTER — Ambulatory Visit: Payer: Medicare Other | Admitting: *Deleted

## 2014-03-03 ENCOUNTER — Encounter (HOSPITAL_COMMUNITY): Payer: Self-pay | Admitting: Pharmacy Technician

## 2014-03-03 NOTE — Progress Notes (Signed)
Surgery on 03/08/14.  Need orders in EPIC.  Thank You. +

## 2014-03-04 ENCOUNTER — Encounter: Payer: Self-pay | Admitting: Cardiology

## 2014-03-04 ENCOUNTER — Other Ambulatory Visit: Payer: Self-pay | Admitting: Orthopedic Surgery

## 2014-03-04 ENCOUNTER — Other Ambulatory Visit (HOSPITAL_COMMUNITY): Payer: Self-pay | Admitting: Orthopedic Surgery

## 2014-03-04 DIAGNOSIS — I2510 Atherosclerotic heart disease of native coronary artery without angina pectoris: Secondary | ICD-10-CM | POA: Insufficient documentation

## 2014-03-04 NOTE — Progress Notes (Signed)
Surgery on 03/08/14.  Proep on 03/05/14 at 1100am.  Need orders in EPIC.  Thank You.

## 2014-03-04 NOTE — Progress Notes (Signed)
Preoperative surgical orders have been place into the Epic hospital system for Johnathan Rodriguez on 03/04/2014, 10:47 AM  by Mickel Crow for surgery on 03-08-2014.  Preop Total Hip orders including Experel Injecion, IV Tylenol, and IV Decadron as long as there are no contraindications to the above medications. Arlee Muslim, PA-C

## 2014-03-05 ENCOUNTER — Encounter (HOSPITAL_COMMUNITY)
Admission: RE | Admit: 2014-03-05 | Discharge: 2014-03-05 | Disposition: A | Discharge status: Home / Self Care | Payer: Medicare Other | Source: Ambulatory Visit | Attending: Orthopedic Surgery | Admitting: Orthopedic Surgery

## 2014-03-05 ENCOUNTER — Encounter (HOSPITAL_COMMUNITY): Payer: Self-pay

## 2014-03-05 ENCOUNTER — Ambulatory Visit (HOSPITAL_COMMUNITY)
Admission: RE | Admit: 2014-03-05 | Discharge: 2014-03-05 | Disposition: A | Discharge status: Home / Self Care | Payer: Medicare Other | Source: Ambulatory Visit | Attending: Orthopedic Surgery | Admitting: Orthopedic Surgery

## 2014-03-05 ENCOUNTER — Ambulatory Visit (HOSPITAL_COMMUNITY): Admission: RE | Admit: 2014-03-05 | Payer: Medicare Other | Source: Ambulatory Visit

## 2014-03-05 DIAGNOSIS — Z01818 Encounter for other preprocedural examination: Secondary | ICD-10-CM | POA: Insufficient documentation

## 2014-03-05 DIAGNOSIS — M47814 Spondylosis without myelopathy or radiculopathy, thoracic region: Secondary | ICD-10-CM | POA: Insufficient documentation

## 2014-03-05 DIAGNOSIS — Z79899 Other long term (current) drug therapy: Secondary | ICD-10-CM

## 2014-03-05 DIAGNOSIS — Z01812 Encounter for preprocedural laboratory examination: Secondary | ICD-10-CM | POA: Insufficient documentation

## 2014-03-05 DIAGNOSIS — Z96649 Presence of unspecified artificial hip joint: Secondary | ICD-10-CM

## 2014-03-05 DIAGNOSIS — Z7982 Long term (current) use of aspirin: Secondary | ICD-10-CM | POA: Insufficient documentation

## 2014-03-05 LAB — URINALYSIS, ROUTINE W REFLEX MICROSCOPIC
Bilirubin Urine: NEGATIVE
Glucose, UA: NEGATIVE mg/dL
Hgb urine dipstick: NEGATIVE
Ketones, ur: NEGATIVE mg/dL
Leukocytes, UA: NEGATIVE
Nitrite: NEGATIVE
PROTEIN: NEGATIVE mg/dL
Specific Gravity, Urine: 1.021 (ref 1.005–1.030)
UROBILINOGEN UA: 0.2 mg/dL (ref 0.0–1.0)
pH: 5 (ref 5.0–8.0)

## 2014-03-05 LAB — PROTIME-INR
INR: 1.03 (ref 0.00–1.49)
Prothrombin Time: 13.3 seconds (ref 11.6–15.2)

## 2014-03-05 LAB — COMPREHENSIVE METABOLIC PANEL
ALT: 23 U/L (ref 0–53)
AST: 22 U/L (ref 0–37)
Albumin: 4.2 g/dL (ref 3.5–5.2)
Alkaline Phosphatase: 65 U/L (ref 39–117)
BUN: 18 mg/dL (ref 6–23)
CALCIUM: 9.9 mg/dL (ref 8.4–10.5)
CO2: 27 meq/L (ref 19–32)
Chloride: 98 mEq/L (ref 96–112)
Creatinine, Ser: 1.15 mg/dL (ref 0.50–1.35)
GFR calc Af Amer: 73 mL/min — ABNORMAL LOW (ref >90)
GFR, EST NON AFRICAN AMERICAN: 63 mL/min — AB (ref >90)
Glucose, Bld: 100 mg/dL — ABNORMAL HIGH (ref 70–99)
POTASSIUM: 4.9 meq/L (ref 3.7–5.3)
SODIUM: 135 meq/L — AB (ref 137–147)
Total Bilirubin: 0.7 mg/dL (ref 0.3–1.2)
Total Protein: 8 g/dL (ref 6.0–8.3)

## 2014-03-05 LAB — CBC
HCT: 41.5 % (ref 39.0–52.0)
Hemoglobin: 14.1 g/dL (ref 13.0–17.0)
MCH: 30.9 pg (ref 26.0–34.0)
MCHC: 34 g/dL (ref 30.0–36.0)
MCV: 90.8 fL (ref 78.0–100.0)
PLATELETS: 256 10*3/uL (ref 150–400)
RBC: 4.57 MIL/uL (ref 4.22–5.81)
RDW: 12.2 % (ref 11.5–15.5)
WBC: 6.5 10*3/uL (ref 4.0–10.5)

## 2014-03-05 LAB — SURGICAL PCR SCREEN
MRSA, PCR: NEGATIVE
STAPHYLOCOCCUS AUREUS: POSITIVE — AB

## 2014-03-05 LAB — APTT: APTT: 29 s (ref 24–37)

## 2014-03-05 NOTE — Patient Instructions (Addendum)
KAYSHAWN OZBURN  03/05/2014                           YOUR PROCEDURE IS SCHEDULED ON:  03/08/14               PLEASE REPORT TO SHORT STAY CENTER AT : 11:30 AM               CALL THIS NUMBER IF ANY PROBLEMS THE DAY OF SURGERY :               [phone]                                REMEMBER:   Do not eat food or drink liquids AFTER MIDNIGHT   May have clear liquids UNTIL 6 HOURS BEFORE SURGERY (8:30 AM)               Take these medicines the morning of surgery with A SIP OF WATER:  PREVACID / METOPROLOL   Do not wear jewelry, make-up   Do not wear lotions, powders, or perfumes.   Do not shave legs or underarms 12 hrs. before surgery (men may shave face)  Do not bring valuables to the hospital.  Contacts, dentures or bridgework may not be worn into surgery.  Leave suitcase in the car. After surgery it may be brought to your room.  For patients admitted to the hospital more than one night, checkout time is            11:00 AM                                                       The day of discharge.   Patients discharged the day of surgery will not be allowed to drive home.            If going home same day of surgery, must have someone stay with you              FIRST 24 hrs at home and arrange for some one to drive you              home from hospital.    Special Instructions             Please read over the following fact sheets that you were given:               1. [place]               2. INCENTIVE SPIROMETER               3. MRSA INFORMATION SHEET                                                X_____________________________________________________________________        Failure to follow these instructions may result in cancellation of your surgery

## 2014-03-05 NOTE — Progress Notes (Signed)
03/05/14 1035  OBSTRUCTIVE SLEEP APNEA  Have you ever been diagnosed with sleep apnea through a sleep study? No  Do you snore loudly (loud enough to be heard through closed doors)?  1  Do you often feel tired, fatigued, or sleepy during the daytime? 0  Has anyone observed you stop breathing during your sleep? 0  Do you have, or are you being treated for high blood pressure? 1  BMI more than 35 kg/m2? 0  Age over 70 years old? 1  Neck circumference greater than 40 cm/18 inches? 0  Gender: 1  Obstructive Sleep Apnea Score 4  Score 4 or greater  Results sent to PCP

## 2014-03-08 ENCOUNTER — Inpatient Hospital Stay (HOSPITAL_COMMUNITY)
Admission: RE | Admit: 2014-03-08 | Discharge: 2014-03-11 | DRG: 470 | Disposition: A | Discharge status: Home Health | Payer: Medicare Other | Source: Ambulatory Visit | Attending: Orthopedic Surgery | Admitting: Orthopedic Surgery

## 2014-03-08 ENCOUNTER — Inpatient Hospital Stay (HOSPITAL_COMMUNITY): Payer: Medicare Other

## 2014-03-08 ENCOUNTER — Inpatient Hospital Stay (HOSPITAL_COMMUNITY): Payer: Medicare Other | Admitting: Certified Registered Nurse Anesthetist

## 2014-03-08 ENCOUNTER — Encounter (HOSPITAL_COMMUNITY)
Admission: RE | Disposition: A | Discharge status: Home Health | Payer: Self-pay | Source: Ambulatory Visit | Attending: Orthopedic Surgery

## 2014-03-08 ENCOUNTER — Encounter (HOSPITAL_COMMUNITY): Payer: Medicare Other | Admitting: Certified Registered Nurse Anesthetist

## 2014-03-08 ENCOUNTER — Encounter (HOSPITAL_COMMUNITY): Payer: Self-pay

## 2014-03-08 DIAGNOSIS — Z823 Family history of stroke: Secondary | ICD-10-CM

## 2014-03-08 DIAGNOSIS — E7800 Pure hypercholesterolemia, unspecified: Secondary | ICD-10-CM | POA: Diagnosis present

## 2014-03-08 DIAGNOSIS — Z7982 Long term (current) use of aspirin: Secondary | ICD-10-CM

## 2014-03-08 DIAGNOSIS — E871 Hypo-osmolality and hyponatremia: Secondary | ICD-10-CM | POA: Diagnosis not present

## 2014-03-08 DIAGNOSIS — E119 Type 2 diabetes mellitus without complications: Secondary | ICD-10-CM | POA: Diagnosis present

## 2014-03-08 DIAGNOSIS — G589 Mononeuropathy, unspecified: Secondary | ICD-10-CM | POA: Diagnosis present

## 2014-03-08 DIAGNOSIS — I10 Essential (primary) hypertension: Secondary | ICD-10-CM | POA: Diagnosis present

## 2014-03-08 DIAGNOSIS — Z96649 Presence of unspecified artificial hip joint: Secondary | ICD-10-CM | POA: Diagnosis not present

## 2014-03-08 DIAGNOSIS — R339 Retention of urine, unspecified: Secondary | ICD-10-CM

## 2014-03-08 DIAGNOSIS — I2510 Atherosclerotic heart disease of native coronary artery without angina pectoris: Secondary | ICD-10-CM | POA: Diagnosis present

## 2014-03-08 DIAGNOSIS — M1610 Unilateral primary osteoarthritis, unspecified hip: Principal | ICD-10-CM | POA: Diagnosis present

## 2014-03-08 DIAGNOSIS — E8779 Other fluid overload: Secondary | ICD-10-CM | POA: Diagnosis not present

## 2014-03-08 DIAGNOSIS — Z87891 Personal history of nicotine dependence: Secondary | ICD-10-CM | POA: Diagnosis not present

## 2014-03-08 DIAGNOSIS — I252 Old myocardial infarction: Secondary | ICD-10-CM

## 2014-03-08 DIAGNOSIS — K219 Gastro-esophageal reflux disease without esophagitis: Secondary | ICD-10-CM | POA: Diagnosis present

## 2014-03-08 DIAGNOSIS — Z79899 Other long term (current) drug therapy: Secondary | ICD-10-CM

## 2014-03-08 DIAGNOSIS — Z96642 Presence of left artificial hip joint: Secondary | ICD-10-CM

## 2014-03-08 DIAGNOSIS — Z951 Presence of aortocoronary bypass graft: Secondary | ICD-10-CM

## 2014-03-08 DIAGNOSIS — Z8249 Family history of ischemic heart disease and other diseases of the circulatory system: Secondary | ICD-10-CM | POA: Diagnosis not present

## 2014-03-08 DIAGNOSIS — M169 Osteoarthritis of hip, unspecified: Secondary | ICD-10-CM | POA: Diagnosis present

## 2014-03-08 DIAGNOSIS — E8770 Fluid overload, unspecified: Secondary | ICD-10-CM

## 2014-03-08 HISTORY — PX: TOTAL HIP ARTHROPLASTY: SHX124

## 2014-03-08 LAB — GLUCOSE, CAPILLARY
GLUCOSE-CAPILLARY: 77 mg/dL (ref 70–99)
GLUCOSE-CAPILLARY: 107 mg/dL — AB (ref 70–99)
GLUCOSE-CAPILLARY: 180 mg/dL — AB (ref 70–99)
Glucose-Capillary: 86 mg/dL (ref 70–99)
Glucose-Capillary: 94 mg/dL (ref 70–99)

## 2014-03-08 LAB — TYPE AND SCREEN
ABO/RH(D): O NEG
Antibody Screen: NEGATIVE

## 2014-03-08 SURGERY — ARTHROPLASTY, HIP, TOTAL,POSTERIOR APPROACH
Anesthesia: General | Site: Hip | Laterality: Left

## 2014-03-08 MED ORDER — HYDROMORPHONE HCL PF 1 MG/ML IJ SOLN
INTRAMUSCULAR | Status: AC
  Filled 2014-03-08: qty 1

## 2014-03-08 MED ORDER — DEXTROSE 50 % IV SOLN
12.5000 mL | Freq: Once | INTRAVENOUS | Status: AC
  Administered 2014-03-08: 12.5 mL via INTRAVENOUS
  Filled 2014-03-08: qty 50

## 2014-03-08 MED ORDER — CEFAZOLIN SODIUM-DEXTROSE 2-3 GM-% IV SOLR
INTRAVENOUS | Status: AC
  Filled 2014-03-08: qty 50

## 2014-03-08 MED ORDER — ACETAMINOPHEN 325 MG PO TABS: 650.0000 mg | ORAL_TABLET | Freq: Four times a day (QID) | ORAL | Status: DC | PRN

## 2014-03-08 MED ORDER — KETOROLAC TROMETHAMINE 15 MG/ML IJ SOLN
7.5000 mg | Freq: Four times a day (QID) | INTRAMUSCULAR | Status: AC | PRN
  Administered 2014-03-08: 7.5 mg via INTRAVENOUS

## 2014-03-08 MED ORDER — ROCURONIUM BROMIDE 100 MG/10ML IV SOLN
INTRAVENOUS | Status: AC
Start: 2014-03-08 — End: 2014-03-08
  Filled 2014-03-08: qty 1

## 2014-03-08 MED ORDER — PROMETHAZINE HCL 25 MG/ML IJ SOLN
INTRAMUSCULAR | Status: AC
Start: 2014-03-08 — End: 2014-03-09
  Filled 2014-03-08: qty 1

## 2014-03-08 MED ORDER — LACTATED RINGERS IV SOLN
INTRAVENOUS | Status: DC
  Administered 2014-03-08 (×2): via INTRAVENOUS

## 2014-03-08 MED ORDER — 0.9 % SODIUM CHLORIDE (POUR BTL) OPTIME
TOPICAL | Status: DC | PRN
  Administered 2014-03-08: 1000 mL

## 2014-03-08 MED ORDER — BISACODYL 10 MG RE SUPP
10.0000 mg | Freq: Every day | RECTAL | Status: DC | PRN
Start: 2014-03-08 — End: 2014-03-11

## 2014-03-08 MED ORDER — METOCLOPRAMIDE HCL 10 MG PO TABS: 5.0000 mg | ORAL_TABLET | Freq: Three times a day (TID) | ORAL | Status: DC | PRN

## 2014-03-08 MED ORDER — PANTOPRAZOLE SODIUM 20 MG PO TBEC
20.0000 mg | DELAYED_RELEASE_TABLET | Freq: Every day | ORAL | Status: DC
  Filled 2014-03-08: qty 1

## 2014-03-08 MED ORDER — SODIUM CHLORIDE 0.9 % IV SOLN
INTRAVENOUS | Status: DC
  Administered 2014-03-09: 03:00:00 via INTRAVENOUS

## 2014-03-08 MED ORDER — BUPIVACAINE LIPOSOME 1.3 % IJ SUSP
INTRAMUSCULAR | Status: DC | PRN
  Administered 2014-03-08: 20 mL

## 2014-03-08 MED ORDER — NEOSTIGMINE METHYLSULFATE 1 MG/ML IJ SOLN
INTRAMUSCULAR | Status: DC | PRN
  Administered 2014-03-08: 4 mg via INTRAVENOUS

## 2014-03-08 MED ORDER — CEFAZOLIN SODIUM-DEXTROSE 2-3 GM-% IV SOLR
2.0000 g | INTRAVENOUS | Status: AC
  Administered 2014-03-08: 2 g via INTRAVENOUS

## 2014-03-08 MED ORDER — HYDROMORPHONE HCL PF 1 MG/ML IJ SOLN
0.2500 mg | INTRAMUSCULAR | Status: DC | PRN
  Administered 2014-03-08: 0.25 mg via INTRAVENOUS
  Administered 2014-03-08 (×2): 0.5 mg via INTRAVENOUS
  Administered 2014-03-08 (×3): 0.25 mg via INTRAVENOUS

## 2014-03-08 MED ORDER — DEXAMETHASONE SODIUM PHOSPHATE 10 MG/ML IJ SOLN
INTRAMUSCULAR | Status: AC
  Filled 2014-03-08: qty 1

## 2014-03-08 MED ORDER — ONDANSETRON HCL 4 MG PO TABS: 4.0000 mg | ORAL_TABLET | Freq: Four times a day (QID) | ORAL | Status: DC | PRN

## 2014-03-08 MED ORDER — ACETAMINOPHEN 650 MG RE SUPP
650.0000 mg | Freq: Four times a day (QID) | RECTAL | Status: DC | PRN
Start: 2014-03-09 — End: 2014-03-11

## 2014-03-08 MED ORDER — MIDAZOLAM HCL 5 MG/5ML IJ SOLN
INTRAMUSCULAR | Status: DC | PRN
  Administered 2014-03-08: 2 mg via INTRAVENOUS

## 2014-03-08 MED ORDER — ASPIRIN EC 81 MG PO TBEC
81.0000 mg | DELAYED_RELEASE_TABLET | Freq: Every day | ORAL | Status: DC
  Administered 2014-03-09 – 2014-03-11 (×3): 81 mg via ORAL
  Filled 2014-03-08 (×3): qty 1

## 2014-03-08 MED ORDER — SODIUM CHLORIDE 0.9 % IJ SOLN
INTRAMUSCULAR | Status: AC
  Filled 2014-03-08: qty 50

## 2014-03-08 MED ORDER — SODIUM CHLORIDE 0.9 % IV SOLN
INTRAVENOUS | Status: DC
  Administered 2014-03-08: 1000 mL via INTRAVENOUS

## 2014-03-08 MED ORDER — PROMETHAZINE HCL 25 MG/ML IJ SOLN
6.2500 mg | INTRAMUSCULAR | Status: DC | PRN
  Administered 2014-03-08: 6.25 mg via INTRAVENOUS

## 2014-03-08 MED ORDER — DEXAMETHASONE SODIUM PHOSPHATE 10 MG/ML IJ SOLN
INTRAMUSCULAR | Status: DC | PRN
  Administered 2014-03-08: 10 mg via INTRAVENOUS

## 2014-03-08 MED ORDER — METOPROLOL TARTRATE 25 MG PO TABS
25.0000 mg | ORAL_TABLET | Freq: Two times a day (BID) | ORAL | Status: DC
  Administered 2014-03-09 – 2014-03-11 (×5): 25 mg via ORAL
  Filled 2014-03-08 (×7): qty 1

## 2014-03-08 MED ORDER — LIDOCAINE HCL (CARDIAC) 20 MG/ML IV SOLN
INTRAVENOUS | Status: AC
Start: 2014-03-08 — End: 2014-03-08
  Filled 2014-03-08: qty 5

## 2014-03-08 MED ORDER — DEXTROSE 50 % IV SOLN
INTRAVENOUS | Status: AC
  Filled 2014-03-08: qty 50

## 2014-03-08 MED ORDER — FENTANYL CITRATE 0.05 MG/ML IJ SOLN
INTRAMUSCULAR | Status: DC | PRN
Start: 2014-03-08 — End: 2014-03-08
  Administered 2014-03-08 (×5): 50 ug via INTRAVENOUS
  Administered 2014-03-08: 100 ug via INTRAVENOUS

## 2014-03-08 MED ORDER — KETOROLAC TROMETHAMINE 15 MG/ML IJ SOLN
INTRAMUSCULAR | Status: AC
  Filled 2014-03-08: qty 1

## 2014-03-08 MED ORDER — FENTANYL CITRATE 0.05 MG/ML IJ SOLN
INTRAMUSCULAR | Status: AC
  Filled 2014-03-08: qty 5

## 2014-03-08 MED ORDER — GLYCOPYRROLATE 0.2 MG/ML IJ SOLN
INTRAMUSCULAR | Status: DC | PRN
  Administered 2014-03-08: 0.6 mg via INTRAVENOUS

## 2014-03-08 MED ORDER — TRAMADOL HCL 50 MG PO TABS: 50.0000 mg | ORAL_TABLET | Freq: Four times a day (QID) | ORAL | Status: DC | PRN

## 2014-03-08 MED ORDER — ONDANSETRON HCL 4 MG/2ML IJ SOLN
INTRAMUSCULAR | Status: DC | PRN
  Administered 2014-03-08: 4 mg via INTRAVENOUS

## 2014-03-08 MED ORDER — SUCCINYLCHOLINE CHLORIDE 20 MG/ML IJ SOLN
INTRAMUSCULAR | Status: DC | PRN
  Administered 2014-03-08: 100 mg via INTRAVENOUS

## 2014-03-08 MED ORDER — POLYETHYLENE GLYCOL 3350 17 G PO PACK: 17.0000 g | PACK | Freq: Every day | ORAL | Status: DC | PRN

## 2014-03-08 MED ORDER — MENTHOL 3 MG MT LOZG: 1.0000 | LOZENGE | OROMUCOSAL | Status: DC | PRN

## 2014-03-08 MED ORDER — ONDANSETRON HCL 4 MG/2ML IJ SOLN
INTRAMUSCULAR | Status: AC
  Filled 2014-03-08: qty 2

## 2014-03-08 MED ORDER — DEXAMETHASONE SODIUM PHOSPHATE 10 MG/ML IJ SOLN
10.0000 mg | Freq: Every day | INTRAMUSCULAR | Status: AC
  Filled 2014-03-08: qty 1

## 2014-03-08 MED ORDER — CEFAZOLIN SODIUM-DEXTROSE 2-3 GM-% IV SOLR
2.0000 g | Freq: Four times a day (QID) | INTRAVENOUS | Status: AC
  Administered 2014-03-08 – 2014-03-09 (×2): 2 g via INTRAVENOUS
  Filled 2014-03-08 (×2): qty 50

## 2014-03-08 MED ORDER — BUPIVACAINE HCL (PF) 0.25 % IJ SOLN
INTRAMUSCULAR | Status: AC
  Filled 2014-03-08: qty 30

## 2014-03-08 MED ORDER — MIDAZOLAM HCL 2 MG/2ML IJ SOLN
INTRAMUSCULAR | Status: AC
  Filled 2014-03-08: qty 2

## 2014-03-08 MED ORDER — METHOCARBAMOL 500 MG PO TABS
500.0000 mg | ORAL_TABLET | Freq: Four times a day (QID) | ORAL | Status: DC | PRN
Start: 2014-03-08 — End: 2014-03-11
  Administered 2014-03-09 – 2014-03-10 (×4): 500 mg via ORAL
  Filled 2014-03-08 (×4): qty 1

## 2014-03-08 MED ORDER — METHOCARBAMOL 100 MG/ML IJ SOLN
500.0000 mg | Freq: Four times a day (QID) | INTRAVENOUS | Status: DC | PRN
  Administered 2014-03-08: 500 mg via INTRAVENOUS
  Filled 2014-03-08: qty 5

## 2014-03-08 MED ORDER — BUPIVACAINE HCL 0.25 % IJ SOLN
INTRAMUSCULAR | Status: DC | PRN
  Administered 2014-03-08: 20 mL

## 2014-03-08 MED ORDER — DEXTROSE 50 % IV SOLN
6.2500 g | Freq: Once | INTRAVENOUS | Status: AC
  Administered 2014-03-08: 6.25 g via INTRAVENOUS

## 2014-03-08 MED ORDER — FENTANYL CITRATE 0.05 MG/ML IJ SOLN
INTRAMUSCULAR | Status: AC
  Filled 2014-03-08: qty 2

## 2014-03-08 MED ORDER — ROCURONIUM BROMIDE 100 MG/10ML IV SOLN
INTRAVENOUS | Status: DC | PRN
  Administered 2014-03-08: 20 mg via INTRAVENOUS

## 2014-03-08 MED ORDER — STERILE WATER FOR IRRIGATION IR SOLN
Status: DC | PRN
  Administered 2014-03-08: 1500 mL

## 2014-03-08 MED ORDER — METOCLOPRAMIDE HCL 5 MG/ML IJ SOLN: 5.0000 mg | Freq: Three times a day (TID) | INTRAMUSCULAR | Status: DC | PRN

## 2014-03-08 MED ORDER — GLYCOPYRROLATE 0.2 MG/ML IJ SOLN
INTRAMUSCULAR | Status: AC
  Filled 2014-03-08: qty 3

## 2014-03-08 MED ORDER — SODIUM CHLORIDE 0.9 % IJ SOLN
INTRAMUSCULAR | Status: AC
  Filled 2014-03-08: qty 10

## 2014-03-08 MED ORDER — FLEET ENEMA 7-19 GM/118ML RE ENEM: 1.0000 | ENEMA | Freq: Once | RECTAL | Status: AC | PRN

## 2014-03-08 MED ORDER — NEOSTIGMINE METHYLSULFATE 1 MG/ML IJ SOLN
INTRAMUSCULAR | Status: AC
  Filled 2014-03-08: qty 10

## 2014-03-08 MED ORDER — DOCUSATE SODIUM 100 MG PO CAPS
100.0000 mg | ORAL_CAPSULE | Freq: Two times a day (BID) | ORAL | Status: DC
  Administered 2014-03-08 – 2014-03-11 (×6): 100 mg via ORAL

## 2014-03-08 MED ORDER — BUPIVACAINE LIPOSOME 1.3 % IJ SUSP
20.0000 mL | Freq: Once | INTRAMUSCULAR | Status: DC
  Filled 2014-03-08: qty 20

## 2014-03-08 MED ORDER — DIPHENHYDRAMINE HCL 12.5 MG/5ML PO ELIX: 12.5000 mg | ORAL_SOLUTION | ORAL | Status: DC | PRN

## 2014-03-08 MED ORDER — PHENOL 1.4 % MT LIQD: 1.0000 | OROMUCOSAL | Status: DC | PRN

## 2014-03-08 MED ORDER — ACETAMINOPHEN 10 MG/ML IV SOLN
1000.0000 mg | Freq: Once | INTRAVENOUS | Status: AC
  Administered 2014-03-08: 1000 mg via INTRAVENOUS
  Filled 2014-03-08: qty 100

## 2014-03-08 MED ORDER — OXYCODONE HCL 5 MG PO TABS
5.0000 mg | ORAL_TABLET | ORAL | Status: DC | PRN
  Administered 2014-03-08 – 2014-03-11 (×18): 10 mg via ORAL
  Filled 2014-03-08 (×18): qty 2

## 2014-03-08 MED ORDER — DEXAMETHASONE 6 MG PO TABS
10.0000 mg | ORAL_TABLET | Freq: Every day | ORAL | Status: AC
  Administered 2014-03-09: 10 mg via ORAL
  Filled 2014-03-08: qty 1

## 2014-03-08 MED ORDER — ONDANSETRON HCL 4 MG/2ML IJ SOLN: 4.0000 mg | Freq: Four times a day (QID) | INTRAMUSCULAR | Status: DC | PRN

## 2014-03-08 MED ORDER — RIVAROXABAN 10 MG PO TABS
10.0000 mg | ORAL_TABLET | Freq: Every day | ORAL | Status: DC
  Administered 2014-03-09 – 2014-03-11 (×3): 10 mg via ORAL
  Filled 2014-03-08 (×4): qty 1

## 2014-03-08 MED ORDER — DEXAMETHASONE SODIUM PHOSPHATE 10 MG/ML IJ SOLN: 10.0000 mg | Freq: Once | INTRAMUSCULAR | Status: DC

## 2014-03-08 MED ORDER — PROPOFOL 10 MG/ML IV BOLUS
INTRAVENOUS | Status: AC
  Filled 2014-03-08: qty 20

## 2014-03-08 MED ORDER — ACETAMINOPHEN 500 MG PO TABS
1000.0000 mg | ORAL_TABLET | Freq: Four times a day (QID) | ORAL | Status: AC
  Administered 2014-03-08 – 2014-03-09 (×4): 1000 mg via ORAL
  Filled 2014-03-08 (×6): qty 2

## 2014-03-08 MED ORDER — CHLORHEXIDINE GLUCONATE 4 % EX LIQD: 60.0000 mL | Freq: Once | CUTANEOUS | Status: DC

## 2014-03-08 MED ORDER — PROPOFOL 10 MG/ML IV BOLUS
INTRAVENOUS | Status: DC | PRN
  Administered 2014-03-08: 150 mg via INTRAVENOUS

## 2014-03-08 MED ORDER — MORPHINE SULFATE 2 MG/ML IJ SOLN
1.0000 mg | INTRAMUSCULAR | Status: DC | PRN
  Administered 2014-03-09: 2 mg via INTRAVENOUS
  Filled 2014-03-08: qty 1

## 2014-03-08 SURGICAL SUPPLY — 52 items
BAG SPEC THK2 15X12 ZIP CLS (MISCELLANEOUS)
BAG ZIPLOCK 12X15 (MISCELLANEOUS) IMPLANT
BIT DRILL 2.8X128 (BIT) ×2 IMPLANT
BIT DRILL 2.8X128MM (BIT) ×1
BLADE EXTENDED COATED 6.5IN (ELECTRODE) ×3 IMPLANT
BLADE SAW SAG 73X25 THK (BLADE) ×2
BLADE SAW SGTL 73X25 THK (BLADE) ×1 IMPLANT
CAPT HIP PF COP ×2 IMPLANT
CLOSURE WOUND 1/2 X4 (GAUZE/BANDAGES/DRESSINGS) ×2
DECANTER SPIKE VIAL GLASS SM (MISCELLANEOUS) ×3 IMPLANT
DRAPE INCISE IOBAN 66X45 STRL (DRAPES) ×3 IMPLANT
DRAPE ORTHO SPLIT 77X108 STRL (DRAPES) ×6
DRAPE POUCH INSTRU U-SHP 10X18 (DRAPES) ×3 IMPLANT
DRAPE SURG ORHT 6 SPLT 77X108 (DRAPES) ×2 IMPLANT
DRAPE U-SHAPE 47X51 STRL (DRAPES) ×3 IMPLANT
DRSG ADAPTIC 3X8 NADH LF (GAUZE/BANDAGES/DRESSINGS) ×2 IMPLANT
DRSG MEPILEX BORDER 4X4 (GAUZE/BANDAGES/DRESSINGS) ×3 IMPLANT
DRSG MEPILEX BORDER 4X8 (GAUZE/BANDAGES/DRESSINGS) ×3 IMPLANT
DURAPREP 26ML APPLICATOR (WOUND CARE) ×3 IMPLANT
ELECT REM PT RETURN 9FT ADLT (ELECTROSURGICAL) ×3
ELECTRODE REM PT RTRN 9FT ADLT (ELECTROSURGICAL) ×1 IMPLANT
EVACUATOR 1/8 PVC DRAIN (DRAIN) ×3 IMPLANT
FACESHIELD LNG OPTICON STERILE (SAFETY) ×12 IMPLANT
GLOVE BIO SURGEON STRL SZ7.5 (GLOVE) ×2 IMPLANT
GLOVE BIO SURGEON STRL SZ8 (GLOVE) ×9 IMPLANT
GLOVE BIOGEL PI IND STRL 8 (GLOVE) ×1 IMPLANT
GLOVE BIOGEL PI INDICATOR 8 (GLOVE) ×4
GLOVE SURG SS PI 6.5 STRL IVOR (GLOVE) ×4 IMPLANT
GOWN STRL REUS W/TWL LRG LVL3 (GOWN DISPOSABLE) ×3 IMPLANT
GOWN STRL REUS W/TWL XL LVL3 (GOWN DISPOSABLE) ×6 IMPLANT
IMMOBILIZER KNEE 20 (SOFTGOODS) ×3 IMPLANT
KIT BASIN OR (CUSTOM PROCEDURE TRAY) ×3 IMPLANT
MANIFOLD NEPTUNE II (INSTRUMENTS) ×3 IMPLANT
NDL SAFETY ECLIPSE 18X1.5 (NEEDLE) ×2 IMPLANT
NEEDLE HYPO 18GX1.5 SHARP (NEEDLE) ×6
NS IRRIG 1000ML POUR BTL (IV SOLUTION) ×3 IMPLANT
PACK TOTAL JOINT (CUSTOM PROCEDURE TRAY) ×3 IMPLANT
PASSER SUT SWANSON 36MM LOOP (INSTRUMENTS) ×3 IMPLANT
POSITIONER SURGICAL ARM (MISCELLANEOUS) ×3 IMPLANT
SPONGE GAUZE 4X4 12PLY (GAUZE/BANDAGES/DRESSINGS) ×3 IMPLANT
STRIP CLOSURE SKIN 1/2X4 (GAUZE/BANDAGES/DRESSINGS) ×4 IMPLANT
SUT ETHIBOND NAB CT1 #1 30IN (SUTURE) ×6 IMPLANT
SUT MNCRL AB 4-0 PS2 18 (SUTURE) ×3 IMPLANT
SUT VIC AB 2-0 CT1 27 (SUTURE) ×9
SUT VIC AB 2-0 CT1 TAPERPNT 27 (SUTURE) ×3 IMPLANT
SUT VLOC 180 0 24IN GS25 (SUTURE) ×3 IMPLANT
SYR 20CC LL (SYRINGE) ×3 IMPLANT
SYR 50ML LL SCALE MARK (SYRINGE) ×3 IMPLANT
TOWEL OR 17X26 10 PK STRL BLUE (TOWEL DISPOSABLE) ×6 IMPLANT
TOWEL OR NON WOVEN STRL DISP B (DISPOSABLE) ×3 IMPLANT
TRAY FOLEY METER SIL LF 16FR (CATHETERS) ×2 IMPLANT
WATER STERILE IRR 1500ML POUR (IV SOLUTION) ×3 IMPLANT

## 2014-03-08 NOTE — Anesthesia Preprocedure Evaluation (Addendum)
Anesthesia Evaluation  Patient identified by MRN, date of birth, ID band Patient awake    Reviewed: Allergy & Precautions, H&P , NPO status , Patient's Chart, lab work & pertinent test results  History of Anesthesia Complications (+) PONV and history of anesthetic complications  Airway Mallampati: II TM Distance: >3 FB Neck ROM: Full    Dental no notable dental hx.    Pulmonary asthma , former smoker,  breath sounds clear to auscultation  Pulmonary exam normal       Cardiovascular Exercise Tolerance: Good hypertension, Pt. on medications and Pt. on home beta blockers + CAD and + Past MI Rhythm:Regular Rate:Normal  Cardiology office note 03-04-14 reviewed. EF 50%. Clearance obtained.   Neuro/Psych negative neurological ROS  negative psych ROS   GI/Hepatic Neg liver ROS, GERD-  Medicated,  Endo/Other  diabetes, Type 2, Oral Hypoglycemic Agents  Renal/GU negative Renal ROS  negative genitourinary   Musculoskeletal negative musculoskeletal ROS (+)   Abdominal   Peds negative pediatric ROS (+)  Hematology negative hematology ROS (+)   Anesthesia Other Findings   Reproductive/Obstetrics negative OB ROS                          Anesthesia Physical Anesthesia Plan  ASA: III  Anesthesia Plan: General   Post-op Pain Management:    Induction: Intravenous  Airway Management Planned: Oral ETT  Additional Equipment:   Intra-op Plan:   Post-operative Plan: Extubation in OR  Informed Consent: I have reviewed the patients History and Physical, chart, labs and discussed the procedure including the risks, benefits and alternatives for the proposed anesthesia with the patient or authorized representative who has indicated his/her understanding and acceptance.   Dental advisory given  Plan Discussed with: CRNA  Anesthesia Plan Comments: (Prefers general. Had general for other THA)        Anesthesia Quick Evaluation

## 2014-03-08 NOTE — Anesthesia Postprocedure Evaluation (Signed)
  Anesthesia Post-op Note  Patient: Johnathan Rodriguez  Procedure(s) Performed: Procedure(s) (LRB): LEFT TOTAL HIP ARTHROPLASTY (Left)  Patient Location: PACU  Anesthesia Type: General  Level of Consciousness: awake and alert   Airway and Oxygen Therapy: Patient Spontanous Breathing  Post-op Pain: mild  Post-op Assessment: Post-op Vital signs reviewed, Patient's Cardiovascular Status Stable, Respiratory Function Stable, Patent Airway and No signs of Nausea or vomiting  Last Vitals:  Filed Vitals:   03/08/14 1730  BP: 121/91  Pulse: 75  Temp: 36.7 C  Resp: 15    Post-op Vital Signs: stable   Complications: No apparent anesthesia complications

## 2014-03-08 NOTE — Interval H&P Note (Signed)
History and Physical Interval Note:  03/08/2014 1:43 PM  Johnathan Rodriguez  has presented today for surgery, with the diagnosis of osteoarthritis of the left hip  The various methods of treatment have been discussed with the patient and family. After consideration of risks, benefits and other options for treatment, the patient has consented to  Procedure(s): LEFT TOTAL HIP ARTHROPLASTY (Left) as a surgical intervention .  The patient's history has been reviewed, patient examined, no change in status, stable for surgery.  I have reviewed the patient's chart and labs.  Questions were answered to the patient's satisfaction.     [name]

## 2014-03-08 NOTE — H&P (Signed)
TOTAL HIP ADMISSION H&P  Patient is admitted for left total hip arthroplasty.  Subjective:  Chief Complaint: left hip pain  HPI: Johnathan Rodriguez, 69 y.o. male, has a history of pain and functional disability in the left hip(s) due to arthritis and patient has failed non-surgical conservative treatments for greater than 12 weeks to include NSAID's and/or analgesics, corticosteriod injections and activity modification.  Onset of symptoms was gradual starting 2 years ago with gradually worsening course since that time.The patient noted no past surgery on the left hip(s).  Patient currently rates pain in the left hip at 7 out of 10 with activity. Patient has night pain, worsening of pain with activity and weight bearing, pain that interfers with activities of daily living, pain with passive range of motion and crepitus. Patient has evidence of periarticular osteophytes and joint space narrowing by imaging studies. This condition presents safety issues increasing the risk of falls. There is no current active infection.  Patient Active Problem List   Diagnosis Date Noted  . CAD (coronary artery disease) 03/04/2014  . Postop Hyponatremia 05/20/2012  . OA (osteoarthritis) of hip 05/19/2012   Past Medical History  Diagnosis Date  . PONV (postoperative nausea and vomiting)   . Hypertension   . Coronary artery disease     CABG 2012  . Elevated cholesterol   . Asthma     NO PROBLEMS SINCE 2012  . Arthritis   . Neuropathy     FEET  . Cancer     SKIN CANCER  . GERD (gastroesophageal reflux disease)   . Constipation   . Diabetes mellitus   . Myocardial infarction 2012    Past Surgical History  Procedure Laterality Date  . Knee arthroscopy      BIL  . Appendectomy    . Eye surgery    . Total hip arthroplasty  05/19/2012    Procedure: TOTAL HIP ARTHROPLASTY;  Surgeon: [name], MD;  Location: [facility];  Service: Orthopedics;  Laterality: Right;  . Coronary artery bypass graft      4  VESSELS  DEC 2012    Current outpatient prescriptions: aspirin EC 81 MG tablet, Take 81 mg by mouth daily., Disp: , Rfl: ;   Flaxseed, Linseed, (FLAX SEEDS PO), Take 2 tablets by mouth every evening., Disp: , Rfl: ;   lansoprazole (PREVACID) 15 MG capsule, Take 15 mg by mouth daily at 12 noon., Disp: , Rfl: ;   lisinopril (PRINIVIL,ZESTRIL) 2.5 MG tablet, Take 2.5 mg by mouth daily with breakfast., Disp: , Rfl:  metFORMIN (GLUCOPHAGE) 500 MG tablet, Take 500 mg by mouth 2 (two) times daily with a meal., Disp: , Rfl: ;   metoprolol (LOPRESSOR) 50 MG tablet, Take 25 mg by mouth 2 (two) times daily., Disp: , Rfl: ;   pravastatin (PRAVACHOL) 40 MG tablet, Take 40 mg by mouth every evening. , Disp: , Rfl: ;   Vitamin B-12 (CYANOCOBALAMIN) 1000 MCG tablet, Take 1,000 mcg by mouth daily., Disp: , Rfl:   Allergies  Allergen Reactions  . Codeine Nausea And Vomiting  . Other Nausea And Vomiting    Patient states that he is allergic to a heart medication and it causes vomiting  . Ropinirole Nausea And Vomiting    History  Substance Use Topics  . Smoking status: Former Smoker    Quit date: 05/09/1987  . Smokeless tobacco: Not on file  . Alcohol Use: No    Family History Father. Deceased. age 27 Cerebrovascular Accident.  father Children. Son - age 38 - multiple heart sttacks. Mother. Deceased. age 69 Hypertension. mother Heart Disease. First Degree Relatives. mother and father Cancer. mother Congestive Heart Failure. mother    Review of Systems  Constitutional: Negative.   HENT: Negative.   Eyes: Negative.   Respiratory: Positive for shortness of breath. Negative for cough, hemoptysis, sputum production and wheezing.        SOB on exertion  Cardiovascular: Negative.   Gastrointestinal: Positive for heartburn.  Genitourinary: Negative.   Musculoskeletal: Positive for back pain and joint pain. Negative for falls, myalgias and neck pain.       Left hip pain  Skin: Negative.    Neurological: Negative.   Endo/Heme/Allergies: Negative.   Psychiatric/Behavioral: Negative.     Objective:  Physical Exam  Constitutional: He is oriented to person, place, and time. He appears well-developed and well-nourished. No distress.  HENT:  Head: Normocephalic and atraumatic.  Right Ear: External ear normal.  Left Ear: External ear normal.  Nose: Nose normal.  Mouth/Throat: Oropharynx is clear and moist.  Eyes: Conjunctivae and EOM are normal.  Neck: Normal range of motion. Neck supple.  Cardiovascular: Normal rate, regular rhythm, normal heart sounds and intact distal pulses.   No murmur heard. Respiratory: Effort normal and breath sounds normal. No respiratory distress. He has no wheezes.  GI: Soft. Bowel sounds are normal. He exhibits no distension. There is no tenderness.  Musculoskeletal:       Right hip: Normal.       Left hip: He exhibits decreased range of motion, decreased strength and crepitus.       Right knee: Normal.       Left knee: Normal.       Right lower leg: He exhibits no tenderness and no swelling.       Left lower leg: He exhibits no tenderness and no swelling.  His right hip can be flexed to 120, rotated in 30, out 40 and abducted to 40 with no pain on range of motion of the right hip. The left hip shows flexion to 100. No internal rotation. There is about 10 external rotation and 10-20 abduction with pain.  Neurological: He is alert and oriented to person, place, and time. He has normal strength and normal reflexes. No sensory deficit.  Skin: No rash noted. He is not diaphoretic. No erythema.  Psychiatric: He has a normal mood and affect. His behavior is normal.   Vitals Weight: 184 lb Height: 70 in Body Surface Area: 2.03 m Body Mass Index: 26.4 kg/m Pulse: 64 (Regular) BP: 122/76 (Sitting, Left Arm, Standard)   Imaging Review Plain radiographs demonstrate severe degenerative joint disease of the left hip(s). The bone quality  appears to be adequate for age and reported activity level.  Assessment/Plan:  End stage arthritis, left hip(s)  The patient history, physical examination, clinical judgement of the provider and imaging studies are consistent with end stage degenerative joint disease of the left hip(s) and total hip arthroplasty is deemed medically necessary. The treatment options including medical management, injection therapy, arthroscopy and arthroplasty were discussed at length. The risks and benefits of total hip arthroplasty were presented and reviewed. The risks due to aseptic loosening, infection, stiffness, dislocation/subluxation,  thromboembolic complications and other imponderables were discussed.  The patient acknowledged the explanation, agreed to proceed with the plan and consent was signed. Patient is being admitted for inpatient treatment for surgery, pain control, PT, OT, prophylactic antibiotics, VTE prophylaxis, progressive ambulation and ADL's and  discharge planning.The patient is planning to be discharged home with home health services  Patient will NOT receive [place], PA-C

## 2014-03-08 NOTE — Transfer of Care (Signed)
Immediate Anesthesia Transfer of Care Note  Patient: Johnathan Rodriguez  Procedure(s) Performed: Procedure(s): LEFT TOTAL HIP ARTHROPLASTY (Left)  Patient Location: PACU  Anesthesia Type:General  Level of Consciousness: awake, alert , oriented and patient cooperative  Airway & Oxygen Therapy: Patient Spontanous Breathing and Patient connected to face mask oxygen  Post-op Assessment: Report given to PACU RN and Post -op Vital signs reviewed and stable  Post vital signs: Reviewed and stable  Complications: No apparent anesthesia complications

## 2014-03-08 NOTE — Op Note (Signed)
Pre-operative diagnosis- Osteoarthritis Left hip  Post-operative diagnosis- Osteoarthritis  Left hip  Procedure-  LeftTotal Hip Arthroplasty  Surgeon- Dione Plover. Toluwani Yadav, MD  Assistant- Arlee Muslim, PA-C   Anesthesia  General  EBL- 100 ml   Drain Hemovac   Complication- None  Condition-PACU - hemodynamically stable.   Brief Clinical Note- Johnathan Rodriguez is a 69 y.o. male with end stage arthritis of his left hip with progressively worsening pain and dysfunction. Pain occurs with activity and rest including pain at night. He has tried analgesics, protected weight bearing and rest without benefit. Pain is too severe to attempt physical therapy. Radiographs demonstrate bone on bone arthritis with subchondral cyst formation. He presents now for left THA.  Procedure in detail-   The patient is brought into the operating room and placed on the operating table. After successful administration of General  anesthesia, the patient is placed in the  Right lateral decubitus position with the  Left side up and held in place with the hip positioner. The lower extremity is isolated from the perineum with plastic drapes and time-out is performed by the surgical team. The lower extremity is then prepped and draped in the usual sterile fashion. A short posterolateral incision is made with a ten blade through the subcutaneous tissue to the level of the fascia lata which is incised in line with the skin incision. The sciatic nerve is palpated and protected and the short external rotators and capsule are isolated from the femur. The hip is then dislocated and the center of the femoral head is marked. A trial prosthesis is placed such that the trial head corresponds to the center of the patients' native femoral head. The resection level is marked on the femoral neck and the resection is made with an oscillating saw. The femoral head is removed and femoral retractors placed to gain access to the femoral canal.   The canal finder is passed into the femoral canal and the canal is thoroughly irrigated with sterile saline to remove the fatty contents. Axial reaming is performed to 11.5  mm, proximal reaming to 16 D  and the sleeve machined to a large. A 16 D large trial sleeve is placed into the proximal femur.      The femur is then retracted anteriorly to gain acetabular exposure. Acetabular retractors are placed and the labrum and osteophytes are removed, Acetabular reaming is performed to 53  mm and a 54  mm Pinnacle acetabular shell is placed in anatomic position with excellent purchase. Additional dome screws were not needed. The permanent 36  mm neutral + 4 Marathon liner is placed into the acetabular shell.      The trial femur is then placed into the femoral canal. The size is 16 x 11  stem with a 36 + 6  neck and a 36 + 3 head with the neck version matching  the patients' native anteversion. The hip is reduced with excellent stability with full extension and full external rotation, 70 degrees flexion with 40 degrees adduction and 90 degrees internal rotation and 90 degrees of flexion with 70 degrees of internal rotation. The operative leg is placed on top of the non-operative leg and the leg lengths are found to be equal. The trials are then removed and the permanent implant of the same size is impacted into the femoral canal. The  ceramic femoral head of the same size as the trial is placed and the hip is reduced with the same stability parameters.  The operative leg is again placed on top of the non-operative leg and the leg lengths are found to be equal.      The wound is then copiously irrigated with saline solution and the capsule and short external rotators are re-attached to the femur through drill holes with Ethibond suture. The fascia lata is closed over a hemovac drain with #1 vicryl suture and the fascia lata, gluteal muscles and subcutaneous tissues are injected with Exparel 34ml diluted with saline 30  ml plus 20 ml of .25% Marcaine. The subcutaneous tissues are closed with #1 and2-0 vicryl and the subcuticular layer closed with running 4-0 Monocryl. The drain is hooked to suction, incision cleaned and dried, and steri-srips and a bulky sterile dressing applied. The limb is placed into a knee immobilizer and the patient is awakened and transported to recovery in stable condition.      Please note that a surgical assistant was a medical necessity for this procedure in order to perform it in a safe and expeditious manner. The assistant was necessary to provide retraction to the vital neurovascular structures and to retract and position the limb to allow for anatomic placement of the prosthetic components.  Dione Plover Gavan Nordby, MD    03/08/2014, 4:25 PM

## 2014-03-08 NOTE — Preoperative (Signed)
Beta Blockers   Reason not to administer Beta Blockers:Not Applicable 

## 2014-03-08 NOTE — Progress Notes (Signed)
Call to anesthesia re: [phone]

## 2014-03-09 ENCOUNTER — Encounter (HOSPITAL_COMMUNITY): Payer: Self-pay | Admitting: Orthopedic Surgery

## 2014-03-09 DIAGNOSIS — E8770 Fluid overload, unspecified: Secondary | ICD-10-CM

## 2014-03-09 DIAGNOSIS — E7800 Pure hypercholesterolemia, unspecified: Secondary | ICD-10-CM | POA: Diagnosis present

## 2014-03-09 DIAGNOSIS — I10 Essential (primary) hypertension: Secondary | ICD-10-CM | POA: Diagnosis present

## 2014-03-09 DIAGNOSIS — K219 Gastro-esophageal reflux disease without esophagitis: Secondary | ICD-10-CM | POA: Diagnosis present

## 2014-03-09 LAB — CBC
HCT: 34.1 % — ABNORMAL LOW (ref 39.0–52.0)
Hemoglobin: 12 g/dL — ABNORMAL LOW (ref 13.0–17.0)
MCH: 31.5 pg (ref 26.0–34.0)
MCHC: 35.2 g/dL (ref 30.0–36.0)
MCV: 89.5 fL (ref 78.0–100.0)
Platelets: 247 10*3/uL (ref 150–400)
RBC: 3.81 MIL/uL — AB (ref 4.22–5.81)
RDW: 12.1 % (ref 11.5–15.5)
WBC: 10.7 10*3/uL — AB (ref 4.0–10.5)

## 2014-03-09 LAB — BASIC METABOLIC PANEL
BUN: 17 mg/dL (ref 6–23)
CALCIUM: 8.6 mg/dL (ref 8.4–10.5)
CO2: 26 meq/L (ref 19–32)
CREATININE: 1.18 mg/dL (ref 0.50–1.35)
Chloride: 96 mEq/L (ref 96–112)
GFR calc non Af Amer: 61 mL/min — ABNORMAL LOW (ref >90)
GFR, EST AFRICAN AMERICAN: 71 mL/min — AB (ref >90)
Glucose, Bld: 164 mg/dL — ABNORMAL HIGH (ref 70–99)
Potassium: 4.4 mEq/L (ref 3.7–5.3)
SODIUM: 135 meq/L — AB (ref 137–147)

## 2014-03-09 LAB — GLUCOSE, CAPILLARY
GLUCOSE-CAPILLARY: 140 mg/dL — AB (ref 70–99)
GLUCOSE-CAPILLARY: 140 mg/dL — AB (ref 70–99)
GLUCOSE-CAPILLARY: 142 mg/dL — AB (ref 70–99)
Glucose-Capillary: 131 mg/dL — ABNORMAL HIGH (ref 70–99)

## 2014-03-09 MED ORDER — FUROSEMIDE 10 MG/ML IJ SOLN
20.0000 mg | Freq: Once | INTRAMUSCULAR | Status: AC
  Administered 2014-03-09: 20 mg via INTRAVENOUS
  Filled 2014-03-09: qty 2

## 2014-03-09 MED ORDER — SIMVASTATIN 40 MG PO TABS
40.0000 mg | ORAL_TABLET | Freq: Every day | ORAL | Status: DC
  Administered 2014-03-09 – 2014-03-10 (×2): 40 mg via ORAL
  Filled 2014-03-09 (×3): qty 1

## 2014-03-09 MED ORDER — LANSOPRAZOLE 15 MG PO CPDR
15.0000 mg | DELAYED_RELEASE_CAPSULE | Freq: Every day | ORAL | Status: DC
  Administered 2014-03-09 – 2014-03-11 (×3): 15 mg via ORAL
  Filled 2014-03-09 (×3): qty 1

## 2014-03-09 MED ORDER — NON FORMULARY: 15.0000 mg | Freq: Every day | Status: DC

## 2014-03-09 NOTE — Evaluation (Signed)
Physical Therapy Evaluation Patient Details Name: Johnathan Rodriguez MRN: [id number] DOB: 10-06-1945 Today's Date: 03/09/2014 Time: 7209-4709 PT Time Calculation (min): 24 min  PT Assessment / Plan / Recommendation History of Present Illness  LEFT TOTAL HIP ARTHROPLASTY (Left)  Clinical Impression  Pt very uncomfortable due to need to urinate. RN aware.  Pt will benefit from PT to address problems listed.     PT Assessment  Patient needs continued PT services    Follow Up Recommendations  Home health PT    Does the patient have the potential to tolerate intense rehabilitation      Barriers to Discharge        Equipment Recommendations  None recommended by PT    Recommendations for Other Services     Frequency 7X/week    Precautions / Restrictions Precautions Precautions: Posterior Hip Precaution Comments: Pt unable to state any precautions. Issued hip precaution handout and reviewed all.  Restrictions Weight Bearing Restrictions: No   Pertinent Vitals/Pain L hip without pain      Mobility  Bed Mobility Overal bed mobility: Needs Assistance Bed Mobility: Supine to Sit Supine to sit: Min assist General bed mobility comments: min verbal cues for THPs, pt in a hurry to try to urinate Transfers Overall transfer level: Needs assistance Equipment used: Rolling walker (2 wheeled) Transfers: Sit to/from Stand Sit to Stand: Min guard General transfer comment: verbal cues for hand placement and L LE from bed and from [facility] Ambulation/Gait Ambulation/Gait assistance: Min guard Ambulation Distance (Feet): 20 Feet (x 2) Assistive device: Rolling walker (2 wheeled) General Gait Details: cues for safety and weight bear as tolerated    Exercises     PT Diagnosis: Difficulty walking  PT Problem List: Decreased strength;Decreased activity tolerance;Decreased mobility;Decreased knowledge of precautions;Decreased safety awareness;Decreased knowledge of use of DME PT Treatment  Interventions: DME instruction;Gait training;Stair training;Functional mobility training;Therapeutic activities;Therapeutic exercise;Patient/family education     PT Goals(Current goals can be found in the care plan section) Acute Rehab PT Goals Patient Stated Goal: to urinate PT Goal Formulation: With patient/family Time For Goal Achievement: 03/13/14 Potential to Achieve Goals: Good  Visit Information  Last PT Received On: 03/09/14 Assistance Needed: +1 History of Present Illness: LEFT TOTAL HIP ARTHROPLASTY (Left)       Prior [place] expects to be discharged to:: Private residence Living Arrangements: Spouse/significant other Available Help at Discharge: Family Type of Home: House Home Access: Stairs to enter Technical brewer of Steps: 5+3 +3 Entrance Stairs-Rails: None Home Layout: One level Home Equipment: Bedside commode;Shower seat;Walker - 2 wheels;Crutches;Cane - single point;Adaptive equipment Adaptive Equipment: Reacher;Sock aid;Long-handled shoe horn Prior Function Level of Independence: Independent with assistive device(s) Comments: used cane sometimes Communication Communication: No difficulties    Cognition  Cognition Arousal/Alertness: Awake/alert Behavior During Therapy: Anxious (due to need for urinating) Overall Cognitive Status: Within Functional Limits for tasks assessed    Extremity/Trunk Assessment Upper Extremity Assessment Upper Extremity Assessment: Defer to OT evaluation Lower Extremity Assessment Lower Extremity Assessment: LLE deficits/detail LLE Deficits / Details: support to get to  edge of bed   Balance    End of Session PT - End of Session Activity Tolerance: Patient tolerated treatment well Patient left: in bed;with call bell/phone within reach Nurse Communication: Mobility status (dressing bloody)  GP     [name] 03/09/2014, 1:04 PM Tresa Endo PT [phone]

## 2014-03-09 NOTE — Progress Notes (Signed)
Physical Therapy Treatment Patient Details Name: Johnathan Rodriguez MRN: [id number] DOB: September 18, 1945 Today's Date: 03/09/2014 Time: 6761-9509 PT Time Calculation (min): 30 min  PT Assessment / Plan / Recommendation  History of Present Illness LEFT TOTAL HIP ARTHROPLASTY (Left)   PT Comments   Pt tolerated very well. Plans Dc tomorrow.  Follow Up Recommendations  Home health PT     Does the patient have the potential to tolerate intense rehabilitation     Barriers to Discharge        Equipment Recommendations  None recommended by PT    Recommendations for Other Services    Frequency 7X/week   Progress towards PT Goals Progress towards PT goals: Progressing toward goals  Plan Current plan remains appropriate    Precautions / Restrictions Precautions Precautions: Posterior Hip   Pertinent Vitals/Pain Sore L hip    Mobility  Bed Mobility Bed Mobility: Supine to Sit;Sit to Supine Supine to sit: Min assist Sit to supine: Min assist General bed mobility comments: min verbal cues for THPs Transfers Equipment used: Rolling walker (2 wheeled) Transfers: Sit to/from Stand Sit to Stand: Supervision General transfer comment: verbal cues for hand placement and L LE from bed and from [facility] Ambulation/Gait Ambulation/Gait assistance: Supervision Ambulation Distance (Feet): 400 Feet Assistive device: Rolling walker (2 wheeled) General Gait Details: cues for safety and weight bear as tolerated    Exercises Total Joint Exercises Quad Sets: AROM;Both;10 reps Short Arc Quad: AROM;Left;10 reps;Supine Heel Slides: AROM;Left;10 reps;Supine Hip ABduction/ADduction: AROM;10 reps;Supine   PT Diagnosis:    PT Problem List:   PT Treatment Interventions:     PT Goals (current goals can now be found in the care plan section)    Visit Information  Last PT Received On: 03/09/14 Assistance Needed: +1 History of Present Illness: LEFT TOTAL HIP ARTHROPLASTY (Left)    Subjective Data      Cognition  Cognition Arousal/Alertness: Awake/alert    Balance     End of Session PT - End of Session Activity Tolerance: Patient tolerated treatment well Patient left: in bed;with call bell/phone within reach;with family/visitor present Nurse Communication: Mobility status   GP     [name] 03/09/2014, 5:42 PM

## 2014-03-09 NOTE — Evaluation (Signed)
Occupational Therapy Evaluation Patient Details Name: Johnathan Rodriguez MRN: [id number] DOB: [birth year] Today's Date: 03/09/2014 Time: 0254-2706 OT Time Calculation (min): 21 min  OT Assessment / Plan / Recommendation History of present illness LEFT TOTAL HIP ARTHROPLASTY (Left)   Clinical Impression   Pt is moving well and is overall at min assist level for toilet transfers and toileting. Will have assist PRN by wife at home. Will benefit from acute OT to progress safety and independence with self care tasks for d/c home with wife.    OT Assessment  Patient needs continued OT Services    Follow Up Recommendations  No OT follow up;Supervision/Assistance - 24 hour    Barriers to Discharge      Equipment Recommendations  None recommended by OT    Recommendations for Other Services    Frequency  Min 2X/week    Precautions / Restrictions Precautions Precautions: Posterior Hip Precaution Comments: Pt unable to state any precautions. Issued hip precaution handout and reviewed all.  Restrictions Weight Bearing Restrictions: No   Pertinent Vitals/Pain No complaint of    ADL  Eating/Feeding: Simulated;Independent Where Assessed - Eating/Feeding: Chair Grooming: Simulated;Wash/dry hands;Set up Where Assessed - Grooming: Supported sitting Upper Body Bathing: Simulated;Chest;Right arm;Left arm;Abdomen;Set up;Supervision/safety Where Assessed - Upper Body Bathing: Unsupported sitting Lower Body Bathing: Simulated;Moderate assistance (without AE) Where Assessed - Lower Body Bathing: Supported sit to stand Upper Body Dressing: Simulated;Set up;Supervision/safety Where Assessed - Upper Body Dressing: Unsupported sitting Lower Body Dressing: Simulated;Moderate assistance (without AE) Where Assessed - Lower Body Dressing: Supported sit to Lobbyist:  Min assist (mostly for negotiating tighter spaces) Science writer: Raised toilet seat with arms (or 3-in-1 over  toilet) Toileting - Clothing Manipulation and Hygiene: Simulated;Minimal assistance Where Assessed - Best boy and Hygiene: Standing Equipment Used: Rolling walker ADL Comments: Pt's wife present for session. Reviewed AE uses but pt and wife state they feel comfortable with how to use AE from previous hip surgery and decline need to practice. Pt does move a little quickly at times and needs min verbal cues to keep walker on the floor at times. Reviewed all hip precautions and posted handout.     OT Diagnosis: Generalized weakness  OT Problem List: Decreased strength;Decreased knowledge of use of DME or AE;Decreased knowledge of precautions OT Treatment Interventions: Self-care/ADL training;Patient/family education;DME and/or AE instruction;Therapeutic activities   OT Goals(Current goals can be found in the care plan section) Acute Rehab OT Goals Patient Stated Goal: get up and walk OT Goal Formulation: With patient/family Time For Goal Achievement: 03/16/14 Potential to Achieve Goals: Good  Visit Information  Last OT Received On: 03/09/14 Assistance Needed: +1 History of Present Illness: LEFT TOTAL HIP ARTHROPLASTY (Left)       Prior [place] expects to be discharged to:: Private residence Living Arrangements: Spouse/significant other Available Help at Discharge: Family Type of Home: House Home Access: Stairs to enter Technical brewer of Steps: 5+3 +3 Entrance Stairs-Rails: None Home Layout: One level Home Equipment: Bedside commode;Shower seat;Walker - 2 wheels;Crutches;Cane - single point;Adaptive equipment Adaptive Equipment: Reacher;Sock aid;Long-handled shoe horn Prior Function Level of Independence: Independent with assistive device(s) Comments: used cane sometimes Communication Communication: No difficulties         Vision/Perception     Cognition  Cognition Arousal/Alertness:  Awake/alert Behavior During Therapy: WFL for tasks assessed/performed Overall Cognitive Status: Within Functional Limits for tasks assessed    Extremity/Trunk Assessment Upper Extremity Assessment Upper Extremity Assessment:  Overall [facility] for tasks assessed     Mobility Bed Mobility Overal bed mobility: Needs Assistance Bed Mobility: Supine to Sit Supine to sit: Min guard General bed mobility comments: min verbal cues for THPs Transfers Overall transfer level: Needs assistance Equipment used: Rolling walker (2 wheeled) Transfers: Sit to/from Stand Sit to Stand: Min guard General transfer comment: verbal cues for hand placement and L LE     Exercise     Balance     End of Session OT - End of Session Equipment Utilized During Treatment: Rolling walker Activity Tolerance: Patient tolerated treatment well Patient left: in chair;with call bell/phone within reach;with family/visitor present  GO     Jules Schick [phone] 03/09/2014, 12:30 PM

## 2014-03-09 NOTE — Progress Notes (Signed)
Utilization review completed.  

## 2014-03-09 NOTE — Progress Notes (Signed)
   CARE MANAGEMENT NOTE 03/09/2014  Patient:  Johnathan Rodriguez, Johnathan Rodriguez   Account Number:  [id number]  Date Initiated:  03/09/2014  Documentation initiated by:  [facility]  Subjective/Objective Assessment:   LEFT TOTAL HIP ARTHROPLASTY     Action/Plan:   [place]   Anticipated DC Date:  03/11/2014   Anticipated DC Plan:  [place]  CM consult      [facility] Choice  HOME HEALTH   Choice offered to / List presented to:  C-1 Patient        [place] arranged  HH-2 PT      [place]   Status of service:  Completed, signed off Medicare Important Message given?   (If response is "NO", the following Medicare IM given date fields will be blank) Date Medicare IM given:   Date Additional Medicare IM given:    Discharge Disposition:  [place]  Per UR Regulation:    If discussed at Long Length of Stay Meetings, dates discussed:    Comments:  03/09/2014 1120 Pt waiting for final recommendation for home. He wants [facility] and [place] for [facility]. Jonnie Finner RN CCM Case Mgmt phone [phone]

## 2014-03-09 NOTE — Progress Notes (Signed)
.     Subjective: 1 Day Post-Op Procedure(s) (LRB): LEFT TOTAL HIP ARTHROPLASTY (Left) Patient reports pain as moderate.   Patient seen in rounds with Dr. Wynelle Link.  Had a rough night but a little better this morning.  He wants to look into SNF at [facility].   Patient is well, but has had some minor complaints of pain in the hip, requiring pain medications We will start therapy today.  Plan is to go Skilled nursing facility after hospital stay.  Objective: Vital signs in last 24 hours: Temp:  [97.5 F (36.4 C)-98.5 F (36.9 C)] 98.4 F (36.9 C) (03/10 0607) Pulse Rate:  [64-96] 91 (03/10 0607) Resp:  [12-24] 18 (03/10 0607) BP: (103-153)/(61-97) 124/74 mmHg (03/10 0607) SpO2:  [94 %-100 %] 94 % (03/10 0607) Weight:  [83.462 kg (184 lb)] 83.462 kg (184 lb) (03/09 2028)  Intake/Output from previous day:  Intake/Output Summary (Last 24 hours) at 03/09/14 0917 Last data filed at 03/09/14 0700  Gross per 24 hour  Intake   4760 ml  Output   1785 ml  Net   2975 ml    Intake/Output this shift: UOP 1200 +2975  Labs:  Recent Labs  03/09/14 0425  HGB 12.0*    Recent Labs  03/09/14 0425  WBC 10.7*  RBC 3.81*  HCT 34.1*  PLT 247    Recent Labs  03/09/14 0425  NA 135*  K 4.4  CL 96  CO2 26  BUN 17  CREATININE 1.18  GLUCOSE 164*  CALCIUM 8.6   No results found for this basename: LABPT, INR,  in the last 72 hours  EXAM General - Patient is Alert, Appropriate and Oriented Extremity - Neurovascular intact Sensation intact distally Dressing - dressing C/D/I Motor Function - intact, moving foot and toes well on exam.  Hemovac pulled without difficulty.  Past Medical History  Diagnosis Date  . PONV (postoperative nausea and vomiting)   . Hypertension   . Coronary artery disease     CABG 2012  . Elevated cholesterol   . Asthma     NO PROBLEMS SINCE 2012  . Arthritis   . Neuropathy     FEET  . Cancer     SKIN CANCER  . GERD (gastroesophageal  reflux disease)   . Constipation   . Diabetes mellitus   . Myocardial infarction 2012    Assessment/Plan: 1 Day Post-Op Procedure(s) (LRB): LEFT TOTAL HIP ARTHROPLASTY (Left) Active Problems:   OA (osteoarthritis) of hip   Postop Hyponatremia - likely dilutional, diurese fluids this AM due to overload.   CAD (coronary artery disease) - S/P CABG, no complaints, on metoprolol and back on ASA 81 mg   Unspecified essential hypertension - metoprolol   Pure hypercholesterolemia - resumed pravastatin   GERD (gastroesophageal reflux disease) - resumed Prevacid   Volume overload postop - diurese with Lasix this morning.  Estimated body mass index is 26.4 kg/(m^2) as calculated from the following:   Height as of this encounter: 5\' 10"  (1.778 m).   Weight as of this encounter: 83.462 kg (184 lb). Advance diet Up with therapy Discharge to SNF Diurese fluids with Lasix once and monitor I&O's  DVT Prophylaxis - Xarelto and Aspirin 81 mg Weight Bearing As Tolerated left Leg D/C Knee Immobilizer Hemovac Pulled Begin Therapy Hip Preacutions  Jayliana Valencia 03/09/2014, 9:17 AM

## 2014-03-09 NOTE — Progress Notes (Signed)
CSW consulted for SNF placement. CSW met with pt / family and reviewed PT recommendations. Pt states he will go home with [facility] services following hospital d/c. PT recommends HHPT. RNCM will assist with d/c planning needs.  Werner Lean LCSW [phone]

## 2014-03-09 NOTE — Discharge Instructions (Addendum)
Dr. Gaynelle Arabian Total Joint Specialist [facility] [address], [place], [place] [address] [phone]   TOTAL HIP REPLACEMENT POSTOPERATIVE DIRECTIONS    Hip Rehabilitation, Guidelines Following Surgery  The results of a hip operation are greatly improved after range of motion and muscle strengthening exercises. Follow all safety measures which are given to protect your hip. If any of these exercises cause increased pain or swelling in your joint, decrease the amount until you are comfortable again. Then slowly increase the exercises. Call your caregiver if you have problems or questions.  HOME CARE INSTRUCTIONS  Most of the following instructions are designed to prevent the dislocation of your new hip.  Remove items at home which could result in a fall. This includes throw rugs or furniture in walking pathways.  Continue medications as instructed at time of discharge.  You may have some home medications which will be placed on hold until you complete the course of blood thinner medication.  You may start showering once you are discharged home but do not submerge the incision under water. Just pat the incision dry and apply a dry gauze dressing on daily. Do not put on socks or shoes without following the instructions of your caregivers.  Sit on high chairs so your hips are not bent more than 90 degrees.  Sit on chairs with arms. Use the chair arms to help push yourself up when arising.  Keep your leg on the side of the operation out in front of you when standing up.  Arrange for the use of a toilet seat elevator so you are not sitting low.  Do not do any exercises or get in any positions that cause your toes to point in (pigeon toed).  Always sleep with a pillow between your legs. Do not lie on your side in sleep with both knees touching the bed.   Walk with walker as instructed.  You may resume a sexual relationship in one month or when given the OK by  your caregiver.  Use walker as long as suggested by your caregivers.  You may put full weight on your legs and walk as much as is comfortable. Avoid periods of inactivity such as sitting longer than an hour when not asleep. This helps prevent blood clots.  You may return to work once you are cleared by Engineer, production.  Do not drive a car for 6 weeks or until released by your surgeon.  Do not drive while taking narcotics.  Wear elastic stockings for three weeks following surgery during the day but you may remove then at night.  Make sure you keep all of your appointments after your operation with all of your doctors and caregivers. You should call the office at the above phone number and make an appointment for approximately two weeks after the date of your surgery. Change the dressing daily and reapply a dry dressing each time. Please pick up a stool softener and laxative for home use as long as you are requiring pain medications.  Continue to use ice on the hip for pain and swelling from surgery. You may notice swelling that will progress down to the foot and ankle.  This is normal after  surgery.  Elevate the leg when you are not up walking on it.   It is important for you to complete the blood thinner medication as prescribed by your doctor.  Continue to use the breathing machine which will help keep your temperature down.  It  is common for your temperature to cycle up and down following surgery, especially at night when you are not up moving around and exerting yourself.  The breathing machine keeps your lungs expanded and your temperature down.  RANGE OF MOTION AND STRENGTHENING EXERCISES  These exercises are designed to help you keep full movement of your hip joint. Follow your caregiver's or physical therapist's instructions. Perform all exercises about fifteen times, three times per day or as directed. Exercise both hips, even if you have had only one joint replacement. These exercises can be  done on a training (exercise) mat, on the floor, on a table or on a bed. Use whatever works the best and is most comfortable for you. Use music or television while you are exercising so that the exercises are a pleasant break in your day. This will make your life better with the exercises acting as a break in routine you can look forward to.  Lying on your back, slowly slide your foot toward your buttocks, raising your knee up off the floor. Then slowly slide your foot back down until your leg is straight again.  Lying on your back spread your legs as far apart as you can without causing discomfort.  Lying on your side, raise your upper leg and foot straight up from the floor as far as is comfortable. Slowly lower the leg and repeat.  Lying on your back, tighten up the muscle in the front of your thigh (quadriceps muscles). You can do this by keeping your leg straight and trying to raise your heel off the floor. This helps strengthen the largest muscle supporting your knee.  Lying on your back, tighten up the muscles of your buttocks both with the legs straight and with the knee bent at a comfortable angle while keeping your heel on the floor.   SKILLED REHAB INSTRUCTIONS: If the patient is transferred to a skilled rehab facility following release from the hospital, a list of the current medications will be sent to the facility for the patient to continue.  When discharged from the skilled rehab facility, please have the facility set up the patient's [place] prior to being released. Also, the skilled facility will be responsible for providing the patient with their medications at time of release from the facility to include their pain medication, the muscle relaxants, and their blood thinner medication. If the patient is still at the rehab facility at time of the two week follow up appointment, the skilled rehab facility will also need to assist the patient in arranging follow up  appointment in our office and any transportation needs.  MAKE SURE YOU:  Understand these instructions.  Will watch your condition.  Will get help right away if you are not doing well or get worse.  Pick up stool softner and laxative for home. Do not submerge incision under water. May shower. Continue to use ice for pain and swelling from surgery. Hip precautions.  Total Hip Protocol.  Take Xarelto for two and a half more weeks, then discontinue Xarelto. Once the patient has completed the Xarelto, they may resume the 81 mg Aspirin.   Information on my medicine - XARELTO (Rivaroxaban)  This medication education was reviewed with me or my healthcare representative as part of my discharge preparation.  The pharmacist that spoke with me during my hospital stay was:  Absher, Julieta Bellini, RPH  Why was Xarelto prescribed for you? Xarelto was prescribed for you to reduce the risk  of blood clots forming after orthopedic surgery. The medical term for these abnormal blood clots is venous thromboembolism (VTE).  What do you need to know about xarelto ? Take your Xarelto ONCE DAILY at the same time every day. You may take it either with or without food.  If you have difficulty swallowing the tablet whole, you may crush it and mix in applesauce just prior to taking your dose.  Take Xarelto exactly as prescribed by your doctor and DO NOT stop taking Xarelto without talking to the doctor who prescribed the medication.  Stopping without other VTE prevention medication to take the place of Xarelto may increase your risk of developing a clot.  After discharge, you should have regular check-up appointments with your healthcare provider that is prescribing your Xarelto.    What do you do if you miss a dose? If you miss a dose, take it as soon as you remember on the same day then continue your regularly scheduled once daily regimen the next day. Do not take two doses of Xarelto on the same day.    Important Safety Information A possible side effect of Xarelto is bleeding. You should call your healthcare provider right away if you experience any of the following:   Bleeding from an injury or your nose that does not stop.   Unusual colored urine (red or dark brown) or unusual colored stools (red or black).   Unusual bruising for unknown reasons.   A serious fall or if you hit your head (even if there is no bleeding).  Some medicines may interact with Xarelto and might increase your risk of bleeding while on Xarelto. To help avoid this, consult your healthcare provider or pharmacist prior to using any new prescription or non-prescription medications, including herbals, vitamins, non-steroidal anti-inflammatory drugs (NSAIDs) and supplements.  This website has more information on Xarelto: [email or web address]-benson.[email or web address]

## 2014-03-10 LAB — CBC
HCT: 31.6 % — ABNORMAL LOW (ref 39.0–52.0)
Hemoglobin: 11 g/dL — ABNORMAL LOW (ref 13.0–17.0)
MCH: 31.7 pg (ref 26.0–34.0)
MCHC: 34.8 g/dL (ref 30.0–36.0)
MCV: 91.1 fL (ref 78.0–100.0)
Platelets: 236 10*3/uL (ref 150–400)
RBC: 3.47 MIL/uL — ABNORMAL LOW (ref 4.22–5.81)
RDW: 12.5 % (ref 11.5–15.5)
WBC: 11.9 10*3/uL — ABNORMAL HIGH (ref 4.0–10.5)

## 2014-03-10 LAB — BASIC METABOLIC PANEL
BUN: 19 mg/dL (ref 6–23)
CALCIUM: 9 mg/dL (ref 8.4–10.5)
CO2: 29 mEq/L (ref 19–32)
CREATININE: 1.21 mg/dL (ref 0.50–1.35)
Chloride: 96 mEq/L (ref 96–112)
GFR, EST AFRICAN AMERICAN: 69 mL/min — AB (ref >90)
GFR, EST NON AFRICAN AMERICAN: 59 mL/min — AB (ref >90)
Glucose, Bld: 125 mg/dL — ABNORMAL HIGH (ref 70–99)
Potassium: 4.4 mEq/L (ref 3.7–5.3)
SODIUM: 135 meq/L — AB (ref 137–147)

## 2014-03-10 LAB — GLUCOSE, CAPILLARY
GLUCOSE-CAPILLARY: 153 mg/dL — AB (ref 70–99)
GLUCOSE-CAPILLARY: 136 mg/dL — AB (ref 70–99)
Glucose-Capillary: 124 mg/dL — ABNORMAL HIGH (ref 70–99)
Glucose-Capillary: 104 mg/dL — ABNORMAL HIGH (ref 70–99)

## 2014-03-10 NOTE — Progress Notes (Signed)
   Subjective: 2 Days Post-Op Procedure(s) (LRB): LEFT TOTAL HIP ARTHROPLASTY (Left) Patient reports pain as mild.   Patient seen in rounds with Dr. Wynelle Link. Patient is well, and has had no acute complaints or problems We will start therapy today.  Plan is to go Home after hospital stay.  He initially wanted to look into SNF but now home.  Objective: Vital signs in last 24 hours: Temp:  [98 F (36.7 C)-98.2 F (36.8 C)] 98.2 F (36.8 C) (03/11 2037) Pulse Rate:  [60-63] 63 (03/11 2037) Resp:  [18-20] 20 (03/11 2037) BP: (114-128)/(68-72) 123/71 mmHg (03/11 2037) SpO2:  [93 %-95 %] 93 % (03/11 2037)  Intake/Output from previous day:  Intake/Output Summary (Last 24 hours) at 03/10/14 2111 Last data filed at 03/10/14 2038  Gross per 24 hour  Intake    600 ml  Output   2350 ml  Net  -1750 ml    Intake/Output this shift: Total I/O In: -  Out: 350 [Urine:350]  Labs:  Recent Labs  03/09/14 0425 03/10/14 0428  HGB 12.0* 11.0*    Recent Labs  03/09/14 0425 03/10/14 0428  WBC 10.7* 11.9*  RBC 3.81* 3.47*  HCT 34.1* 31.6*  PLT 247 236    Recent Labs  03/09/14 0425 03/10/14 0428  NA 135* 135*  K 4.4 4.4  CL 96 96  CO2 26 29  BUN 17 19  CREATININE 1.18 1.21  GLUCOSE 164* 125*  CALCIUM 8.6 9.0   No results found for this basename: LABPT, INR,  in the last 72 hours  EXAM General - Patient is Alert, Appropriate and Oriented Extremity - Neurovascular intact Sensation intact distally Dorsiflexion/Plantar flexion intact Dressing - dressing C/D/I Motor Function - intact, moving foot and toes well on exam.    Past Medical History  Diagnosis Date  . PONV (postoperative nausea and vomiting)   . Hypertension   . Coronary artery disease     CABG 2012  . Elevated cholesterol   . Asthma     NO PROBLEMS SINCE 2012  . Arthritis   . Neuropathy     FEET  . Cancer     SKIN CANCER  . GERD (gastroesophageal reflux disease)   . Constipation   . Diabetes  mellitus   . Myocardial infarction 2012    Assessment/Plan: 2 Days Post-Op Procedure(s) (LRB): LEFT TOTAL HIP ARTHROPLASTY (Left) Active Problems:   OA (osteoarthritis) of hip   Postop Hyponatremia   CAD (coronary artery disease)   Unspecified essential hypertension   Pure hypercholesterolemia   GERD (gastroesophageal reflux disease)   Volume overload postop   Estimated body mass index is 26.4 kg/(m^2) as calculated from the following:   Height as of this encounter: 5\' 10"  (1.778 m).   Weight as of this encounter: 83.462 kg (184 lb). Up with therapy Plan for discharge tomorrow Discharge home with home health  DVT Prophylaxis - Aspirin and Xarelto Weight Bearing As Tolerated left Leg Hip Preacutions  PERKINS, ALEXZANDREW 03/10/2014, 9:11 PM

## 2014-03-10 NOTE — Progress Notes (Signed)
03/10/2014 1100 Scheduled dc home with HH. Has RW and 3n1 at home. Jonnie Finner RN CCM Case Mgmt phone [phone]

## 2014-03-10 NOTE — Progress Notes (Signed)
Physical Therapy Treatment Patient Details Name: Johnathan Rodriguez MRN: [id number] DOB: [birth year] Today's Date: 03/10/2014 Time: 9211-9417 PT Time Calculation (min): 22 min  PT Assessment / Plan / Recommendation  History of Present Illness LEFT TOTAL HIP ARTHROPLASTY (Left)   PT Comments   Pt up ad lib in room, plans Dc tomorrow.  Follow Up Recommendations  Home health PT     Does the patient have the potential to tolerate intense rehabilitation     Barriers to Discharge        Equipment Recommendations  None recommended by PT    Recommendations for Other Services    Frequency 7X/week   Progress towards PT Goals Progress towards PT goals: Progressing toward goals  Plan Current plan remains appropriate    Precautions / Restrictions Precautions Precautions: Posterior Hip;Fall   Pertinent Vitals/Pain Reports L  Hip is sore, ice applied.    Mobility  Bed Mobility Supine to sit: Modified independent (Device/Increase time) Sit to supine: Modified independent (Device/Increase time) General bed mobility comments: cues for precautions Transfers Equipment used: Rolling walker (2 wheeled) Transfers: Sit to/from Stand Sit to Stand: Modified independent (Device/Increase time) General transfer comment: cues for safety, pt walks away from RW Ambulation/Gait Ambulation/Gait assistance: Supervision Ambulation Distance (Feet): 400 Feet Assistive device: Rolling walker (2 wheeled) Gait Pattern/deviations: Step-through pattern Stairs: Yes Stairs assistance: Min guard Stair Management: Step to pattern;Backwards;Forwards;With cane;With walker Number of Stairs: 4 General stair comments: attempted forward with cane, pt preferred backward, practiced x 2. cues for sequence and safety    Exercises Total Joint Exercises Quad Sets: AROM;Both;10 reps Short Arc Quad: AROM;Left;10 reps;Supine Heel Slides: AROM;Left;10 reps;Supine Hip ABduction/ADduction: AROM;10 reps;Supine   PT  Diagnosis:    PT Problem List:   PT Treatment Interventions:     PT Goals (current goals can now be found in the care plan section)    Visit Information  Last PT Received On: 03/10/14 Assistance Needed: +1 History of Present Illness: LEFT TOTAL HIP ARTHROPLASTY (Left)    Subjective Data      Cognition  Cognition Arousal/Alertness: Awake/alert Behavior During Therapy: Impulsive    Balance     End of Session PT - End of Session Activity Tolerance: Patient tolerated treatment well Patient left: in bed;with call bell/phone within reach Nurse Communication: Mobility status   GP     Johnathan Rodriguez PT [phone]  03/10/2014, 9:35 AM

## 2014-03-10 NOTE — Progress Notes (Signed)
Occupational Therapy Treatment Patient Details Name: Johnathan Rodriguez MRN: [id number] DOB: 11/11/1945 Today's Date: 03/10/2014 Time: 1020-1036 OT Time Calculation (min): 16 min  OT Assessment / Plan / Recommendation  History of present illness LEFT TOTAL HIP ARTHROPLASTY (Left)   OT comments  Pt is impulsive with activity and needs cues to slow down and use walker safely and for precautions. He plans d/c for tomorrow. Practiced shower transfer safety.   Follow Up Recommendations  No OT follow up;Supervision/Assistance - 24 hour    Barriers to Discharge       Equipment Recommendations  None recommended by OT    Recommendations for Other Services    Frequency Min 2X/week   Progress towards OT Goals Progress towards OT goals: Progressing toward goals  Plan Discharge plan remains appropriate    Precautions / Restrictions Precautions Precautions: Posterior Hip;Fall Precaution Comments: Reviewed precautions with pt Restrictions Weight Bearing Restrictions: No   Pertinent Vitals/Pain "just sore" L hip; reposition, rest    ADL  Toilet Transfer: Simulated;Supervision/safety Tub/Shower Transfer: Simulated;Min guard Equipment Used: Rolling walker ADL Comments: Pt moves quickly and gets up before OT ready or letting OT know he is getting up. Reinforced need to push up with hand on surface/armrest as pt pulled up with the walker before OT ready for him to stand. Practiced shower transfer technique and pt is familiar with it from previous hip surgery. Discussed placement of 3in1 for safety in shower. He has a HH shower also. He has grab bars also. Discussed placing 3in1 toward front of shower and not using low corner seats. Cautioned pt to make sure he doesnt turn L foot in ward as he pivots around in shower to seat. He doesnt have room for 3in1 to face forward toward opening of shower so he will have to use bar to pivot and sit on 3in1.     OT Diagnosis:    OT Problem List:   OT  Treatment Interventions:     OT Goals(current goals can now be found in the care plan section)    Visit Information  Last OT Received On: 03/10/14 Assistance Needed: +1 History of Present Illness: LEFT TOTAL HIP ARTHROPLASTY (Left)    Subjective Data      Prior Functioning       Cognition  Cognition Arousal/Alertness: Awake/alert Behavior During Therapy: Impulsive Overall Cognitive Status: Within Functional Limits for tasks assessed    Mobility  Bed Mobility Bed Mobility: Supine to Sit Supine to sit: Supervision Sit to supine: Supervision General bed mobility comments: for safety and precautions Transfers Overall transfer level: Needs assistance Equipment used: Rolling walker (2 wheeled) Transfers: Sit to/from Stand Sit to Stand: Supervision General transfer comment: verbal cues for hand placement and overall safety       Balance    End of Session OT - End of Session Equipment Utilized During Treatment: Rolling walker Activity Tolerance: Patient tolerated treatment well Patient left: in bed;with call bell/phone within reach  GO     Jules Schick [phone] 03/10/2014, 10:59 AM

## 2014-03-11 DIAGNOSIS — R339 Retention of urine, unspecified: Secondary | ICD-10-CM

## 2014-03-11 LAB — CBC
HCT: 29.4 % — ABNORMAL LOW (ref 39.0–52.0)
Hemoglobin: 10.1 g/dL — ABNORMAL LOW (ref 13.0–17.0)
MCH: 31.6 pg (ref 26.0–34.0)
MCHC: 34.4 g/dL (ref 30.0–36.0)
MCV: 91.9 fL (ref 78.0–100.0)
PLATELETS: 228 10*3/uL (ref 150–400)
RBC: 3.2 MIL/uL — ABNORMAL LOW (ref 4.22–5.81)
RDW: 12.5 % (ref 11.5–15.5)
WBC: 9.8 10*3/uL (ref 4.0–10.5)

## 2014-03-11 LAB — GLUCOSE, CAPILLARY: Glucose-Capillary: 104 mg/dL — ABNORMAL HIGH (ref 70–99)

## 2014-03-11 MED ORDER — RIVAROXABAN 10 MG PO TABS: 10.0000 mg | ORAL_TABLET | Freq: Every day | ORAL | Status: DC

## 2014-03-11 MED ORDER — TRAMADOL HCL 50 MG PO TABS: 50.0000 mg | ORAL_TABLET | Freq: Four times a day (QID) | ORAL | Status: DC | PRN

## 2014-03-11 MED ORDER — METHOCARBAMOL 500 MG PO TABS: 500.0000 mg | ORAL_TABLET | Freq: Four times a day (QID) | ORAL | Status: DC | PRN

## 2014-03-11 MED ORDER — OXYCODONE HCL 5 MG PO TABS: 5.0000 mg | ORAL_TABLET | ORAL | Status: DC | PRN

## 2014-03-11 NOTE — Progress Notes (Signed)
Physical Therapy Treatment Patient Details Name: Johnathan Rodriguez MRN: [id number] DOB: 1945-04-17 Today's Date: 03/11/2014 Time: 8242-3536 PT Time Calculation (min): 26 min  PT Assessment / Plan / Recommendation  History of Present Illness LEFT TOTAL HIP ARTHROPLASTY (Left)   PT Comments   Pt ambulated in hallway and performed exercises.  Pt worried about urinating, does not want to go home with foley.  Pt had no further questions/concerns regarding therapy and ready to d/c home.   Follow Up Recommendations  Home health PT     Does the patient have the potential to tolerate intense rehabilitation     Barriers to Discharge        Equipment Recommendations  None recommended by PT    Recommendations for Other Services    Frequency 7X/week   Progress towards PT Goals Progress towards PT goals: Progressing toward goals  Plan Current plan remains appropriate    Precautions / Restrictions Precautions Precautions: Posterior Hip;Fall Precaution Comments: pt able to recall 1/3 precautions, reviewed with pt Restrictions Weight Bearing Restrictions: No   Pertinent Vitals/Pain Reports soreness more so then pain in L hip, ice pack applied, repositioned    Mobility  Bed Mobility Overal bed mobility: Needs Assistance Bed Mobility: Supine to Sit Supine to sit: Supervision General bed mobility comments: verbal cues for precautions Transfers Overall transfer level: Needs assistance Equipment used: Rolling walker (2 wheeled) Transfers: Sit to/from Stand Sit to Stand: Supervision General transfer comment: verbal cues for hand placement and overall safety Ambulation/Gait Ambulation/Gait assistance: Supervision Ambulation Distance (Feet): 360 Feet Assistive device: Rolling walker (2 wheeled) Gait Pattern/deviations: Step-through pattern General Gait Details: verbal cues for RW distance General stair comments: pt decliend practicing stairs again    Exercises Total Joint  Exercises Ankle Circles/Pumps: AROM;Both;10 reps Quad Sets: AROM;Both;10 reps;Supine Gluteal Sets: AROM;Both;10 reps;Supine Towel Squeeze: AROM;Both;10 reps;Supine Short Arc Quad: AROM;Left;10 reps;Supine Heel Slides: Left;10 reps;Supine;AAROM;Other (comment);PROM (AAROM today for pain control) Hip ABduction/ADduction: AROM;10 reps;Supine;Left   PT Diagnosis:    PT Problem List:   PT Treatment Interventions:     PT Goals (current goals can now be found in the care plan section)    Visit Information  Last PT Received On: 03/11/14 Assistance Needed: +1 History of Present Illness: LEFT TOTAL HIP ARTHROPLASTY (Left)    Subjective Data      Cognition  Cognition Arousal/Alertness: Awake/alert Behavior During Therapy: WFL for tasks assessed/performed Overall Cognitive Status: Within Functional Limits for tasks assessed    Balance     End of Session PT - End of Session Activity Tolerance: Patient tolerated treatment well Patient left: with call bell/phone within reach;in chair   GP     Shealyn Sean,KATHrine E 03/11/2014, 11:26 AM Carmelia Bake, PT, DPT 03/11/2014 Pager: [phone]

## 2014-03-11 NOTE — Progress Notes (Signed)
   Subjective: 3 Days Post-Op Procedure(s) (LRB): LEFT TOTAL HIP ARTHROPLASTY (Left) Patient reports pain as mild.   Patient seen in rounds with Dr. Wynelle Link. Patient is well, but has had some minor complaints of urinary retention last night requiring cath Patient is ready to go home from a therapy standpoint walking about 400 feet yesterday.  Objective: Vital signs in last 24 hours: Temp:  [98 F (36.7 C)-98.2 F (36.8 C)] 98.2 F (36.8 C) (03/12 0527) Pulse Rate:  [60-66] 66 (03/12 0527) Resp:  [16-20] 18 (03/12 0527) BP: (112-128)/(70-72) 112/70 mmHg (03/12 0527) SpO2:  [93 %-95 %] 94 % (03/12 0527)  Intake/Output from previous day:  Intake/Output Summary (Last 24 hours) at 03/11/14 0917 Last data filed at 03/11/14 0531  Gross per 24 hour  Intake    600 ml  Output   1925 ml  Net  -1325 ml    Intake/Output this shift:    Labs:  Recent Labs  03/09/14 0425 03/10/14 0428 03/11/14 0412  HGB 12.0* 11.0* 10.1*    Recent Labs  03/10/14 0428 03/11/14 0412  WBC 11.9* 9.8  RBC 3.47* 3.20*  HCT 31.6* 29.4*  PLT 236 228    Recent Labs  03/09/14 0425 03/10/14 0428  NA 135* 135*  K 4.4 4.4  CL 96 96  CO2 26 29  BUN 17 19  CREATININE 1.18 1.21  GLUCOSE 164* 125*  CALCIUM 8.6 9.0   No results found for this basename: LABPT, INR,  in the last 72 hours  EXAM: General - Patient is Alert, Appropriate and Oriented Extremity - Neurovascular intact Sensation intact distally Incision - clean, dry Motor Function - intact, moving foot and toes well on exam.   Assessment/Plan: 3 Days Post-Op Procedure(s) (LRB): LEFT TOTAL HIP ARTHROPLASTY (Left) Procedure(s) (LRB): LEFT TOTAL HIP ARTHROPLASTY (Left) Past Medical History  Diagnosis Date  . PONV (postoperative nausea and vomiting)   . Hypertension   . Coronary artery disease     CABG 2012  . Elevated cholesterol   . Asthma     NO PROBLEMS SINCE 2012  . Arthritis   . Neuropathy     FEET  . Cancer    SKIN CANCER  . GERD (gastroesophageal reflux disease)   . Constipation   . Diabetes mellitus   . Myocardial infarction 2012   Active Problems:   OA (osteoarthritis) of hip   Postop Hyponatremia   CAD (coronary artery disease)   Unspecified essential hypertension   Pure hypercholesterolemia   GERD (gastroesophageal reflux disease)   Volume overload postop   Estimated body mass index is 26.4 kg/(m^2) as calculated from the following:   Height as of this encounter: 5\' 10"  (1.778 m).   Weight as of this encounter: 83.462 kg (184 lb). Up with therapy Discharge home with home health Diet - Cardiac diet and Diabetic diet Follow up - in 2 weeks Activity - WBAT Disposition - Home Condition Upon Discharge - improving, will send home with foley leg bag if unable to void. D/C Meds - See DC Summary DVT Prophylaxis - Xarelto  Bera Pinela 03/11/2014, 9:17 AM

## 2014-03-11 NOTE — Discharge Summary (Signed)
Physician Discharge Summary   Patient ID: Johnathan Rodriguez MRN: [id number] DOB/AGE: 70/06/1945 70 y.o.  Admit date: 03/08/2014 Discharge date: 03/11/2014  Primary Diagnosis:  Osteoarthritis Left hip  Admission Diagnoses:  Past Medical History  Diagnosis Date  . PONV (postoperative nausea and vomiting)   . Hypertension   . Coronary artery disease     CABG 2012  . Elevated cholesterol   . Asthma     NO PROBLEMS SINCE 2012  . Arthritis   . Neuropathy     FEET  . Cancer     SKIN CANCER  . GERD (gastroesophageal reflux disease)   . Constipation   . Diabetes mellitus   . Myocardial infarction 2012   Discharge Diagnoses:   Active Problems:   OA (osteoarthritis) of hip   Postop Hyponatremia   CAD (coronary artery disease)   Unspecified essential hypertension   Pure hypercholesterolemia   GERD (gastroesophageal reflux disease)   Volume overload postop    Urinary retention  Estimated body mass index is 26.4 kg/(m^2) as calculated from the following:   Height as of this encounter: '5\' 10"'  (1.778 m).   Weight as of this encounter: 83.462 kg (184 lb).  Procedure(s) (LRB): LEFT TOTAL HIP ARTHROPLASTY (Left)   Consults: None  HPI: Johnathan Rodriguez is a 69 y.o. male with end stage arthritis of his left hip with progressively worsening pain and dysfunction. Pain occurs with activity and rest including pain at night. He has tried analgesics, protected weight bearing and rest without benefit. Pain is too severe to attempt physical therapy. Radiographs demonstrate bone on bone arthritis with subchondral cyst formation. He presents now for left THA.  Laboratory Data: Admission on 03/08/2014, Discharged on 03/11/2014  Component Date Value Ref Range Status  . Glucose-Capillary 03/08/2014 86  70 - 99 mg/dL Final  . Comment 1 03/08/2014 Call MD NNP PA CNM   Final  . Glucose-Capillary 03/08/2014 94  70 - 99 mg/dL Final  . Comment 1 03/08/2014 Notify RN   Final  . Glucose-Capillary  03/08/2014 77  70 - 99 mg/dL Final  . Glucose-Capillary 03/08/2014 107* 70 - 99 mg/dL Final  . WBC 03/09/2014 10.7* 4.0 - 10.5 K/uL Final  . RBC 03/09/2014 3.81* 4.22 - 5.81 MIL/uL Final  . Hemoglobin 03/09/2014 12.0* 13.0 - 17.0 g/dL Final  . HCT 03/09/2014 34.1* 39.0 - 52.0 % Final  . MCV 03/09/2014 89.5  78.0 - 100.0 fL Final  . MCH 03/09/2014 31.5  26.0 - 34.0 pg Final  . MCHC 03/09/2014 35.2  30.0 - 36.0 g/dL Final  . RDW 03/09/2014 12.1  11.5 - 15.5 % Final  . Platelets 03/09/2014 247  150 - 400 K/uL Final  . Sodium 03/09/2014 135* 137 - 147 mEq/L Final  . Potassium 03/09/2014 4.4  3.7 - 5.3 mEq/L Final  . Chloride 03/09/2014 96  96 - 112 mEq/L Final  . CO2 03/09/2014 26  19 - 32 mEq/L Final  . Glucose, Bld 03/09/2014 164* 70 - 99 mg/dL Final  . BUN 03/09/2014 17  6 - 23 mg/dL Final  . Creatinine, Ser 03/09/2014 1.18  0.50 - 1.35 mg/dL Final  . Calcium 03/09/2014 8.6  8.4 - 10.5 mg/dL Final  . GFR calc non Af Amer 03/09/2014 61* >90 mL/min Final  . GFR calc Af Amer 03/09/2014 71* >90 mL/min Final   Comment: (NOTE)  The eGFR has been calculated using the CKD EPI equation.                          This calculation has not been validated in all clinical situations.                          eGFR's persistently <90 mL/min signify possible Chronic Kidney                          Disease.  . Glucose-Capillary 03/08/2014 180* 70 - 99 mg/dL Final  . Glucose-Capillary 03/09/2014 140* 70 - 99 mg/dL Final  . Glucose-Capillary 03/09/2014 131* 70 - 99 mg/dL Final  . WBC 03/10/2014 11.9* 4.0 - 10.5 K/uL Final  . RBC 03/10/2014 3.47* 4.22 - 5.81 MIL/uL Final  . Hemoglobin 03/10/2014 11.0* 13.0 - 17.0 g/dL Final  . HCT 03/10/2014 31.6* 39.0 - 52.0 % Final  . MCV 03/10/2014 91.1  78.0 - 100.0 fL Final  . MCH 03/10/2014 31.7  26.0 - 34.0 pg Final  . MCHC 03/10/2014 34.8  30.0 - 36.0 g/dL Final  . RDW 03/10/2014 12.5  11.5 - 15.5 % Final  . Platelets 03/10/2014 236  150  - 400 K/uL Final  . Sodium 03/10/2014 135* 137 - 147 mEq/L Final  . Potassium 03/10/2014 4.4  3.7 - 5.3 mEq/L Final  . Chloride 03/10/2014 96  96 - 112 mEq/L Final  . CO2 03/10/2014 29  19 - 32 mEq/L Final  . Glucose, Bld 03/10/2014 125* 70 - 99 mg/dL Final  . BUN 03/10/2014 19  6 - 23 mg/dL Final  . Creatinine, Ser 03/10/2014 1.21  0.50 - 1.35 mg/dL Final  . Calcium 03/10/2014 9.0  8.4 - 10.5 mg/dL Final  . GFR calc non Af Amer 03/10/2014 59* >90 mL/min Final  . GFR calc Af Amer 03/10/2014 69* >90 mL/min Final   Comment: (NOTE)                          The eGFR has been calculated using the CKD EPI equation.                          This calculation has not been validated in all clinical situations.                          eGFR's persistently <90 mL/min signify possible Chronic Kidney                          Disease.  . Glucose-Capillary 03/09/2014 140* 70 - 99 mg/dL Final  . Glucose-Capillary 03/09/2014 142* 70 - 99 mg/dL Final  . Glucose-Capillary 03/10/2014 124* 70 - 99 mg/dL Final  . Glucose-Capillary 03/10/2014 153* 70 - 99 mg/dL Final  . Glucose-Capillary 03/10/2014 104* 70 - 99 mg/dL Final  . WBC 03/11/2014 9.8  4.0 - 10.5 K/uL Final  . RBC 03/11/2014 3.20* 4.22 - 5.81 MIL/uL Final  . Hemoglobin 03/11/2014 10.1* 13.0 - 17.0 g/dL Final  . HCT 03/11/2014 29.4* 39.0 - 52.0 % Final  . MCV 03/11/2014 91.9  78.0 - 100.0 fL Final  . MCH 03/11/2014 31.6  26.0 - 34.0 pg Final  . MCHC 03/11/2014 34.4  30.0 - 36.0 g/dL Final  .  RDW 03/11/2014 12.5  11.5 - 15.5 % Final  . Platelets 03/11/2014 228  150 - 400 K/uL Final  . Glucose-Capillary 03/10/2014 136* 70 - 99 mg/dL Final  . Glucose-Capillary 03/11/2014 104* 70 - 99 mg/dL Final  . Comment 1 03/11/2014 Notify RN   Final  . Comment 2 03/11/2014 Documented in Chart   Final  Hospital Outpatient Visit on 03/05/2014  Component Date Value Ref Range Status  . MRSA, PCR 03/05/2014 NEGATIVE  NEGATIVE Final  . Staphylococcus aureus  03/05/2014 POSITIVE* NEGATIVE Final   Comment:                                 The Xpert SA Assay (FDA                          approved for NASAL specimens                          in patients over 67 years of age),                          is one component of                          a comprehensive surveillance                          program.  Test performance has                          been validated by [employer] for patients greater                          than or equal to 3 year old.                          It is not intended                          to diagnose infection nor to                          guide or monitor treatment.  Marland Kitchen aPTT 03/05/2014 29  24 - 37 seconds Final  . WBC 03/05/2014 6.5  4.0 - 10.5 K/uL Final  . RBC 03/05/2014 4.57  4.22 - 5.81 MIL/uL Final  . Hemoglobin 03/05/2014 14.1  13.0 - 17.0 g/dL Final  . HCT 03/05/2014 41.5  39.0 - 52.0 % Final  . MCV 03/05/2014 90.8  78.0 - 100.0 fL Final  . MCH 03/05/2014 30.9  26.0 - 34.0 pg Final  . MCHC 03/05/2014 34.0  30.0 - 36.0 g/dL Final  . RDW 03/05/2014 12.2  11.5 - 15.5 % Final  . Platelets 03/05/2014 256  150 - 400 K/uL Final  . Sodium 03/05/2014 135* 137 - 147 mEq/L Final  . Potassium 03/05/2014 4.9  3.7 - 5.3 mEq/L Final  . Chloride 03/05/2014 98  96 - 112 mEq/L Final  .  CO2 03/05/2014 27  19 - 32 mEq/L Final  . Glucose, Bld 03/05/2014 100* 70 - 99 mg/dL Final  . BUN 03/05/2014 18  6 - 23 mg/dL Final  . Creatinine, Ser 03/05/2014 1.15  0.50 - 1.35 mg/dL Final  . Calcium 03/05/2014 9.9  8.4 - 10.5 mg/dL Final  . Total Protein 03/05/2014 8.0  6.0 - 8.3 g/dL Final  . Albumin 03/05/2014 4.2  3.5 - 5.2 g/dL Final  . AST 03/05/2014 22  0 - 37 U/L Final  . ALT 03/05/2014 23  0 - 53 U/L Final  . Alkaline Phosphatase 03/05/2014 65  39 - 117 U/L Final  . Total Bilirubin 03/05/2014 0.7  0.3 - 1.2 mg/dL Final  . GFR calc non Af Amer 03/05/2014 63* >90 mL/min Final  . GFR calc Af  Amer 03/05/2014 73* >90 mL/min Final   Comment: (NOTE)                          The eGFR has been calculated using the CKD EPI equation.                          This calculation has not been validated in all clinical situations.                          eGFR's persistently <90 mL/min signify possible Chronic Kidney                          Disease.  Marland Kitchen Prothrombin Time 03/05/2014 13.3  11.6 - 15.2 seconds Final  . INR 03/05/2014 1.03  0.00 - 1.49 Final  . ABO/RH(D) 03/05/2014 O NEG   Final  . Antibody Screen 03/05/2014 NEG   Final  . Sample Expiration 03/05/2014 03/11/2014   Final  . Color, Urine 03/05/2014 YELLOW  YELLOW Final  . APPearance 03/05/2014 CLEAR  CLEAR Final  . Specific Gravity, Urine 03/05/2014 1.021  1.005 - 1.030 Final  . pH 03/05/2014 5.0  5.0 - 8.0 Final  . Glucose, UA 03/05/2014 NEGATIVE  NEGATIVE mg/dL Final  . Hgb urine dipstick 03/05/2014 NEGATIVE  NEGATIVE Final  . Bilirubin Urine 03/05/2014 NEGATIVE  NEGATIVE Final  . Ketones, ur 03/05/2014 NEGATIVE  NEGATIVE mg/dL Final  . Protein, ur 03/05/2014 NEGATIVE  NEGATIVE mg/dL Final  . Urobilinogen, UA 03/05/2014 0.2  0.0 - 1.0 mg/dL Final  . Nitrite 03/05/2014 NEGATIVE  NEGATIVE Final  . Leukocytes, UA 03/05/2014 NEGATIVE  NEGATIVE Final   MICROSCOPIC NOT DONE ON URINES WITH NEGATIVE PROTEIN, BLOOD, LEUKOCYTES, NITRITE, OR GLUCOSE <1000 mg/dL.     X-Rays:Dg Chest 2 View  03/05/2014   CLINICAL DATA:  Preop total left hip replacement  EXAM: CHEST  2 VIEW  COMPARISON:  DG CHEST 2 VIEW dated 05/08/2012  FINDINGS: There is no focal parenchymal opacity, pleural effusion, or pneumothorax. The heart and mediastinal contours are unremarkable. Prior CABG.  There is mild thoracic spine spondylosis.  IMPRESSION: No active cardiopulmonary disease.   Electronically Signed   By: Kathreen Devoid   On: 03/05/2014 11:52   Dg Hip Complete Left  03/05/2014   CLINICAL DATA:  Preop total left hip  EXAM: LEFT HIP - COMPLETE 2+ VIEW  COMPARISON:   None.  FINDINGS: There is a right hip arthroplasty. There is heterotopic ossification adjacent to the superolateral aspect of the right hip. There is  no left hip fracture or dislocation.  The SI joints are unremarkable.  IMPRESSION: No acute osseous injury of the left hip.   Electronically Signed   By: Kathreen Devoid   On: 03/05/2014 11:53   Dg Pelvis Portable  03/08/2014   CLINICAL DATA:  Postop left hip.  EXAM: PORTABLE PELVIS 1-2 VIEWS  COMPARISON:  04/2012  FINDINGS: Patient has undergone left total hip arthroplasty. On the frontal view, there is no evidence for dislocation or subluxation. No acute fractures identified. Surgical drain overlies the left hip soft tissues. Patient has also had previous right hip arthroplasty.  IMPRESSION: Status post left hip arthroplasty.  No adverse features.   Electronically Signed   By: Shon Hale M.D.   On: 03/08/2014 17:25    EKG: Orders placed during the hospital encounter of 05/19/12  . EKG     Hospital Course: Patient was admitted to [facility] and taken to the OR and underwent the above state procedure without complications.  Patient tolerated the procedure well and was later transferred to the recovery room and then to the orthopaedic floor for postoperative care.  They were given PO and IV analgesics for pain control following their surgery.  They were given 24 hours of postoperative antibiotics of  Anti-infectives   Start     Dose/Rate Route Frequency Ordered Stop   03/08/14 2100  ceFAZolin (ANCEF) IVPB 2 g/50 mL premix     2 g 100 mL/hr over 30 Minutes Intravenous Every 6 hours 03/08/14 1830 03/09/14 0353   03/08/14 1114  ceFAZolin (ANCEF) IVPB 2 g/50 mL premix     2 g 100 mL/hr over 30 Minutes Intravenous On call to O.R. 03/08/14 1114 03/08/14 1512     and started on DVT prophylaxis in the form of Xarelto.   PT and OT were ordered for total hip protocol.  The patient was allowed to be WBAT with therapy. Discharge planning was consulted to  help with postop disposition and equipment needs.  Patient had a rough night on the evening of surgery.  He wanted to look into SNF at [facility].  They started to get up OOB with therapy on day one.  Hemovac drain was pulled without difficulty.  The knee immobilizer was removed and discontinued.  Continued to work with therapy into day two walking 400 feet.  Dressing was changed on day two and the incision was healing well.  By day three, the patient had progressed with therapy and meeting their goals.  Incision was healing well.  Patient was seen in rounds and instead of going to a SNF, he was ready to go home.   Discharge Medications: Prior to Admission medications   Medication Sig Start Date End Date Taking? Authorizing Provider  lansoprazole (PREVACID) 15 MG capsule Take 15 mg by mouth daily at 12 noon.   Yes Historical Provider, MD  lisinopril (PRINIVIL,ZESTRIL) 2.5 MG tablet Take 2.5 mg by mouth daily with breakfast.   Yes Historical Provider, MD  metFORMIN (GLUCOPHAGE) 500 MG tablet Take 500 mg by mouth 2 (two) times daily with a meal.   Yes Historical Provider, MD  metoprolol (LOPRESSOR) 50 MG tablet Take 25 mg by mouth 2 (two) times daily.   Yes Historical Provider, MD  pravastatin (PRAVACHOL) 40 MG tablet Take 40 mg by mouth every evening.    Yes Historical Provider, MD  methocarbamol (ROBAXIN) 500 MG tablet Take 1 tablet (500 mg total) by mouth every 6 (six) hours as needed for  muscle spasms. 03/11/14   Alexzandrew Perkins, PA-C  oxyCODONE (OXY IR/ROXICODONE) 5 MG immediate release tablet Take 1-2 tablets (5-10 mg total) by mouth every 3 (three) hours as needed for moderate pain, severe pain or breakthrough pain. 03/11/14   Alexzandrew Dara Lords, PA-C  rivaroxaban (XARELTO) 10 MG TABS tablet Take 1 tablet (10 mg total) by mouth daily with breakfast. Take Xarelto for two and a half more weeks, then discontinue Xarelto. Once the patient has completed the Xarelto, they may resume the 81  mg Aspirin. 03/11/14   Alexzandrew Perkins, PA-C  traMADol (ULTRAM) 50 MG tablet Take 1-2 tablets (50-100 mg total) by mouth every 6 (six) hours as needed (mild pain). 03/11/14   Alexzandrew Dara Lords, PA-C    Diet: Cardiac diet and Diabetic diet Activity:WBAT No bending hip over 90 degrees- A "L" Angle Do not cross legs Do not let foot roll inward When turning these patients a pillow should be placed between the patient's legs to prevent crossing. Patients should have the affected knee fully extended when trying to sit or stand from all surfaces to prevent excessive hip flexion. When ambulating and turning toward the affected side the affected leg should have the toes turned out prior to moving the walker and the rest of patient's body as to prevent internal rotation/ turning in of the leg. Abduction pillows are the most effective way to prevent a patient from not crossing legs or turning toes in at rest. If an abduction pillow is not ordered placing a regular pillow length wise between the patient's legs is also an effective reminder. It is imperative that these precautions be maintained so that the surgical hip does not dislocate. Follow-up:in 2 weeks Disposition - Home Discharged Condition: good       Discharge Orders   Future Orders Complete By Expires   Call MD / Call 911  As directed    Comments:     If you experience chest pain or shortness of breath, CALL 911 and be transported to the hospital emergency room.  If you develope a fever above 101 F, pus (white drainage) or increased drainage or redness at the wound, or calf pain, call your surgeon's office.   Change dressing  As directed    Comments:     You may change your dressing dressing daily with sterile 4 x 4 inch gauze dressing and paper tape.  Do not submerge the incision under water.   Constipation Prevention  As directed    Comments:     Drink plenty of fluids.  Prune juice may be helpful.  You may use a stool softener,  such as Colace (over the counter) 100 mg twice a day.  Use MiraLax (over the counter) for constipation as needed.   Diet - low sodium heart healthy  As directed    Diet Carb Modified  As directed    Discharge instructions  As directed    Comments:     Pick up stool softner and laxative for home. Do not submerge incision under water. May shower after going home. Continue to use ice for pain and swelling from surgery. Hip precautions.  Total Hip Protocol.  Take Xarelto for two and a half more weeks, then discontinue Xarelto. Once the patient has completed the Xarelto, they may resume the 81 mg Aspirin.   Do not sit on low chairs, stoools or toilet seats, as it may be difficult to get up from low surfaces  As directed    Driving restrictions  As directed    Comments:     No driving until released by the physician.   Follow the hip precautions as taught in Physical Therapy  As directed    Increase activity slowly as tolerated  As directed    Lifting restrictions  As directed    Comments:     No lifting until released by the physician.   Patient may shower  As directed    Comments:     You may shower without a dressing once there is no drainage.  Do not wash over the wound.  If drainage remains, do not shower until drainage stops.   TED hose  As directed    Comments:     Use stockings (TED hose) for 3 weeks on both leg(s).  You may remove them at night for sleeping.   Weight bearing as tolerated  As directed    Questions:     Laterality:     Extremity:         Medication List    STOP taking these medications       aspirin EC 81 MG tablet     FLAX SEEDS PO     vitamin B-12 1000 MCG tablet  Commonly known as:  CYANOCOBALAMIN      TAKE these medications       lansoprazole 15 MG capsule  Commonly known as:  PREVACID  Take 15 mg by mouth daily at 12 noon.     lisinopril 2.5 MG tablet  Commonly known as:  PRINIVIL,ZESTRIL  Take 2.5 mg by mouth daily with breakfast.      metFORMIN 500 MG tablet  Commonly known as:  GLUCOPHAGE  Take 500 mg by mouth 2 (two) times daily with a meal.     methocarbamol 500 MG tablet  Commonly known as:  ROBAXIN  Take 1 tablet (500 mg total) by mouth every 6 (six) hours as needed for muscle spasms.     metoprolol 50 MG tablet  Commonly known as:  LOPRESSOR  Take 25 mg by mouth 2 (two) times daily.     oxyCODONE 5 MG immediate release tablet  Commonly known as:  Oxy IR/ROXICODONE  Take 1-2 tablets (5-10 mg total) by mouth every 3 (three) hours as needed for moderate pain, severe pain or breakthrough pain.     pravastatin 40 MG tablet  Commonly known as:  PRAVACHOL  Take 40 mg by mouth every evening.     rivaroxaban 10 MG Tabs tablet  Commonly known as:  XARELTO  - Take 1 tablet (10 mg total) by mouth daily with breakfast. Take Xarelto for two and a half more weeks, then discontinue Xarelto.  - Once the patient has completed the Xarelto, they may resume the 81 mg Aspirin.     traMADol 50 MG tablet  Commonly known as:  ULTRAM  Take 1-2 tablets (50-100 mg total) by mouth every 6 (six) hours as needed (mild pain).       Follow-up Information   Follow up with [facility]. [facility])    Contact information:   [place] [address] [phone]       Follow up with [name], MD. Schedule an appointment as soon as possible for a visit on 03/24/2014.   Specialty:  Orthopedic Surgery   Contact information:   [address] [place] [address] [phone]       Follow up with [facility]. Schedule an appointment  as soon as possible for a visit in 1 week. (Call Urologist immediately following discharge and setup appointment in next week to be seen for the urinary retention.)       Signed: Mickel Crow 03/23/2014, 3:58 PM

## 2014-03-11 NOTE — Progress Notes (Signed)
Pt. Voided 125 cc & was scanned afterwards for a total of 384.cc #16 Fr foley was inserted with 400 cc clear yellow urine obtained. Pt. & wife were instructed on use of leg bag & large catheter drainage bag.

## 2015-06-10 DIAGNOSIS — C61 Malignant neoplasm of prostate: Secondary | ICD-10-CM

## 2015-06-10 HISTORY — DX: Malignant neoplasm of prostate: C61

## 2015-06-22 ENCOUNTER — Other Ambulatory Visit: Payer: Self-pay | Admitting: Urology

## 2015-06-22 ENCOUNTER — Encounter: Payer: Self-pay | Admitting: Radiation Oncology

## 2015-06-22 NOTE — Progress Notes (Signed)
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  PSA was 13.77 with a Gleason 7  Past/Anticipated interventions by urology, if any: Dr. Binnie Kand: Prostate Biospy  Past/Anticipated interventions by medical oncology, if any: None  Weight changes, if any: lost ~ 2 lbs  Bowel/Bladder complaints, if any: since Starting Flomax he reports improvement of his stream,, less intermittency with voiding, and has complete bladder emptying   Nausea/Vomiting, if any: None  Pain issues, if any: None  SAFETY ISSUES:  Prior radiation? No  Pacemaker/ICD? No  Possible current pregnancy? N/A  Is the patient on methotrexate? No  Current Complaints / other details:

## 2015-06-23 ENCOUNTER — Ambulatory Visit
Admission: RE | Admit: 2015-06-23 | Discharge: 2015-06-23 | Disposition: A | Discharge status: Home / Self Care | Payer: Medicare HMO | Source: Ambulatory Visit | Attending: Radiation Oncology | Admitting: Radiation Oncology

## 2015-06-23 ENCOUNTER — Encounter: Payer: Self-pay | Admitting: Radiation Oncology

## 2015-06-23 VITALS — BP 128/92 | HR 61 | Temp 98.1°F | Ht 69.0 in | Wt 193.8 lb

## 2015-06-23 DIAGNOSIS — E780 Pure hypercholesterolemia: Secondary | ICD-10-CM | POA: Insufficient documentation

## 2015-06-23 DIAGNOSIS — Z79899 Other long term (current) drug therapy: Secondary | ICD-10-CM | POA: Insufficient documentation

## 2015-06-23 DIAGNOSIS — I10 Essential (primary) hypertension: Secondary | ICD-10-CM | POA: Diagnosis not present

## 2015-06-23 DIAGNOSIS — C61 Malignant neoplasm of prostate: Secondary | ICD-10-CM

## 2015-06-23 DIAGNOSIS — Z87891 Personal history of nicotine dependence: Secondary | ICD-10-CM | POA: Diagnosis not present

## 2015-06-23 DIAGNOSIS — I252 Old myocardial infarction: Secondary | ICD-10-CM | POA: Insufficient documentation

## 2015-06-23 DIAGNOSIS — J45909 Unspecified asthma, uncomplicated: Secondary | ICD-10-CM | POA: Diagnosis not present

## 2015-06-23 DIAGNOSIS — I2510 Atherosclerotic heart disease of native coronary artery without angina pectoris: Secondary | ICD-10-CM | POA: Diagnosis not present

## 2015-06-23 DIAGNOSIS — K219 Gastro-esophageal reflux disease without esophagitis: Secondary | ICD-10-CM | POA: Diagnosis not present

## 2015-06-23 DIAGNOSIS — M1990 Unspecified osteoarthritis, unspecified site: Secondary | ICD-10-CM | POA: Diagnosis not present

## 2015-06-23 HISTORY — DX: Personal history of other diseases of the digestive system: Z87.19

## 2015-06-23 HISTORY — DX: Personal history of peptic ulcer disease: Z87.11

## 2015-06-23 HISTORY — DX: Malignant neoplasm of prostate: C61

## 2015-06-23 HISTORY — DX: Nocturia: R35.1

## 2015-06-23 NOTE — Addendum Note (Signed)
Encounter addended by: Benn Moulder, RN on: 06/23/2015  3:32 PM<BR>     Documentation filed: Arn Medal VN

## 2015-06-23 NOTE — Progress Notes (Signed)
[facility] NEW PATIENT EVALUATION  Name: Johnathan Rodriguez MRN: [id number]  Date:   06/23/2015           DOB: 12-Jun-1945  Status: outpatient   CC: Dr. 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 male who is seen today through the courtesy Dr. 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  His complete medical records are not available the time this dictation, , but he tells me that his PSA levels have been less than 2 for many years.  His PSA recently rose to 13.77 on 05/10/2015.  He underwent ultrasound-guided biopsies by Dr. Gaynelle Arabian on 06/10/2015.  His then have Gleason 7 (4+3) involving 10% of one core from the right lateral mid gland.  He also had Gleason 7 (3+4) involving 40% of one core from right lateral apex and 80% of one core from the right apex.  He Gleason 6 (3+3) involving 10% of one core from the left apex.  His gland volume was 41.5 mL.  He tells me that his urination worsens approximately 6-8 months ago with his I PSS score today being 29 while on tamsulosin.  No GI difficulties.  He claims to be potent but he is not sexually active.  Past medical history significant for bilateral hip replacements.  He saw Dr. Alinda Money this past Tuesday for consideration of surgery.  He is felt to be a surgical candidate.   PREVIOUS RADIATION THERAPY: No   PAST MEDICAL HISTORY:  has a past medical history of PONV (postoperative nausea and vomiting); Hypertension; Coronary artery disease; Elevated cholesterol; Asthma; Arthritis; Neuropathy; Cancer; GERD (gastroesophageal reflux disease); Constipation; Diabetes mellitus; Myocardial infarction (12/10/2010); Prostate cancer (06/10/15); Nocturia; and gastric ulcer.      PAST SURGICAL HISTORY:  Past Surgical History  Procedure Laterality Date  . Knee arthroscopy      BIL  . Appendectomy    . Eye surgery    . Total hip arthroplasty  05/19/2012    Procedure: TOTAL HIP ARTHROPLASTY;  Surgeon: [name], MD;  Location: [facility];  Service: Orthopedics;  Laterality: Right;  . Coronary artery bypass graft  12/13/10    4 VESSELS  DEC 2012  . Total hip arthroplasty Left 03/08/2014    Procedure: LEFT TOTAL HIP ARTHROPLASTY;  Surgeon: [name], MD;  Location: [facility];  Service: Orthopedics;  Laterality: Left;     FAMILY HISTORY: family history includes Congestive Heart Failure in his mother; Diverticulosis in his sister; Heart attack in his father; Other in his son.  His father died following a stroke at 75, and his mother died from from, cases of congestive heart failure at 31.  No family history of prostate cancer.   SOCIAL HISTORY:  reports that he quit smoking about 28 years ago. His smoking use included Cigarettes. He has a 6 pack-year smoking history. He does not have any smokeless tobacco history on file. He reports that he does not drink alcohol or use illicit drugs.  Married, 2 children.   ALLERGIES: Codeine; Other; and Ropinirole   MEDICATIONS:  Current Outpatient Prescriptions  Medication Sig Dispense Refill  . Flaxseed, Linseed, (FLAX SEED OIL PO) Take 1,500 mg by mouth daily.    . lansoprazole (PREVACID) 15 MG capsule  Take 15 mg by mouth daily at 12 noon.    Marland Kitchen lisinopril (PRINIVIL,ZESTRIL) 2.5 MG tablet Take 2.5 mg by mouth daily with breakfast.    . metFORMIN (GLUCOPHAGE) 500 MG tablet Take 500 mg by mouth 2 (two) times daily with a meal.    . metoprolol (LOPRESSOR) 50 MG tablet Take 25 mg by mouth 2 (two) times daily. Takes 1/2 pill BID    . pravastatin (PRAVACHOL) 40 MG tablet Take 40 mg by mouth every evening.     . tamsulosin (FLOMAX) 0.4 MG CAPS capsule Take 0.4 mg by mouth daily.     No current facility-administered  medications for this encounter.     REVIEW OF SYSTEMS:  Pertinent items are noted in HPI.    PHYSICAL EXAM:  height is 5\' 9"  (1.753 m) and weight is 193 lb 12.8 oz (87.907 kg). His temperature is 98.1 F (36.7 C). His blood pressure is 128/92 and his pulse is 61.     Alert and oriented.  Nodes: There is no palpable cervical or supraclavicular lymphadenopathy.  Chest: Anterior sternotomy scar from his bypass surgery.  Lungs clear.  Abdomen: Soft without masses organomegaly.  Genitalia: Unremarkable to inspection.  Rectal: The prostate gland is normal in size and is without focal induration or nodularity.  Extremities: Bilateral hip replacements.    LABORATORY DATA:  Lab Results  Component Value Date   WBC 9.8 03/11/2014   HGB 10.1* 03/11/2014   HCT 29.4* 03/11/2014   MCV 91.9 03/11/2014   PLT 228 03/11/2014   Lab Results  Component Value Date   NA 135* 03/10/2014   K 4.4 03/10/2014   CL 96 03/10/2014   CO2 29 03/10/2014   Lab Results  Component Value Date   ALT 23 03/05/2014   AST 22 03/05/2014   ALKPHOS 65 03/05/2014   BILITOT 0.7 03/05/2014   PSA 13.77 from 05/10/2015   IMPRESSION: Stage TIc high-risk adenocarcinoma prostate.  I explained to the patient and his wife that his prognosis is related to his stage, PSA level, and Gleason score.  His stage is favorable while his PSA is of intermediate favorability and his Gleason score of 7 is of intermediate favorability.  Altogether he has high-risk disease.  Management options include robotic prostatectomy versus radiation therapy.  Seed implantation boost is not an option in view of his I PSS score of 29.  External beam/IMRT is not optimal in patients who have had bilateral hip replacements because of difficulties with contouring (unless one does a fusion with Tomotherapy megavoltage radiation) and daily image guidance related to scatter artifact from his hip prostheses.  He tells me that Dr. Alinda Money is willing to consider robotic  surgery which I think would be the best choice for him.  If he is not felt to be a candidate for surgery from a medical standpoint, then we would try to plan him with planning and also treatment on the Tomotherapy unit where there is less scatter artifact with megavoltage radiation.  We also discussed the fact that he may have improved urination following surgery, and he can avoid androgen deprivation therapy which we would typically give for patients with high-risk disease.  He understands that there may be a need for postoperative radiation therapy depending on his pathologic stage and progress following surgery.   PLAN: As discussed above.  He can move ahead with surgical scheduling and cardiac clearance.   I spent 30 minutes face to face with the patient and more than  50% of that time was spent in counseling and/or coordination of care.

## 2015-07-19 NOTE — Patient Instructions (Signed)
Johnathan Rodriguez  07/19/2015   Your procedure is scheduled on: 07/25/2015    Report to [facility] Main  Entrance take [facility]  elevators to 3rd floor to  [place] at       0900 AM.  Call this number if you have problems the morning of surgery [phone]   Remember: ONLY 1 PERSON MAY GO WITH YOU TO SHORT STAY TO GET  READY MORNING OF [place].  Do not eat food or drink liquids :After Midnight.     Take these medicines the morning of surgery with A SIP OF WATER:   Metoprolol ( Lopressor), Flomax ( Tamsulosin )                                You may not have any metal on your body including hair pins and              piercings  Do not wear jewelry, , lotions, powders or perfumes, deodorant                       Men may shave face and neck.   Do not bring valuables to the hospital. [place].  Contacts, dentures or bridgework may not be worn into surgery.  Leave suitcase in the car. After surgery it may be brought to your room.       Special Instructions: coughing and deep breathing exercises, leg exercises               Please read over the following fact sheets you were given: _____________________________________________________________________             [facility] - Preparing for Surgery Before surgery, you can play an important role.  Because skin is not sterile, your skin needs to be as free of germs as possible.  You can reduce the number of germs on your skin by washing with CHG (chlorahexidine gluconate) soap before surgery.  CHG is an antiseptic cleaner which kills germs and bonds with the skin to continue killing germs even after washing. Please DO NOT use if you have an allergy to CHG or antibacterial soaps.  If your skin becomes reddened/irritated stop using the CHG and inform your nurse when you arrive at Short Stay. Do not shave (including legs and underarms) for at least 48 hours  prior to the first CHG shower.  You may shave your face/neck. Please follow these instructions carefully:  1.  Shower with CHG Soap the night before surgery and the  morning of Surgery.  2.  If you choose to wash your hair, wash your hair first as usual with your  normal  shampoo.  3.  After you shampoo, rinse your hair and body thoroughly to remove the  shampoo.                           4.  Use CHG as you would any other liquid soap.  You can apply chg directly  to the skin and wash                       Gently with a scrungie or clean  washcloth.  5.  Apply the CHG Soap to your body ONLY FROM THE NECK DOWN.   Do not use on face/ open                           Wound or open sores. Avoid contact with eyes, ears mouth and genitals (private parts).                       Wash face,  Genitals (private parts) with your normal soap.             6.  Wash thoroughly, paying special attention to the area where your surgery  will be performed.  7.  Thoroughly rinse your body with warm water from the neck down.  8.  DO NOT shower/wash with your normal soap after using and rinsing off  the CHG Soap.                9.  Pat yourself dry with a clean towel.            10.  Wear clean pajamas.            11.  Place clean sheets on your bed the night of your first shower and do not  sleep with pets. Day of Surgery : Do not apply any lotions/deodorants the morning of surgery.  Please wear clean clothes to the hospital/surgery center.  FAILURE TO FOLLOW THESE INSTRUCTIONS MAY RESULT IN THE CANCELLATION OF YOUR SURGERY PATIENT SIGNATURE_________________________________  NURSE SIGNATURE__________________________________  ________________________________________________________________________  WHAT IS A BLOOD TRANSFUSION? Blood Transfusion Information  A transfusion is the replacement of blood or some of its parts. Blood is made up of multiple cells which provide different functions.  Red blood cells carry  oxygen and are used for blood loss replacement.  White blood cells fight against infection.  Platelets control bleeding.  Plasma helps clot blood.  Other blood products are available for specialized needs, such as hemophilia or other clotting disorders. BEFORE THE TRANSFUSION  Who gives blood for transfusions?   Healthy volunteers who are fully evaluated to make sure their blood is safe. This is blood bank blood. Transfusion therapy is the safest it has ever been in the practice of medicine. Before blood is taken from a donor, a complete history is taken to make sure that person has no history of diseases nor engages in risky social behavior (examples are intravenous drug use or sexual activity with multiple partners). The donor's travel history is screened to minimize risk of transmitting infections, such as malaria. The donated blood is tested for signs of infectious diseases, such as HIV and hepatitis. The blood is then tested to be sure it is compatible with you in order to minimize the chance of a transfusion reaction. If you or a relative donates blood, this is often done in anticipation of surgery and is not appropriate for emergency situations. It takes many days to process the donated blood. RISKS AND COMPLICATIONS Although transfusion therapy is very safe and saves many lives, the main dangers of transfusion include:  1. Getting an infectious disease. 2. Developing a transfusion reaction. This is an allergic reaction to something in the blood you were given. Every precaution is taken to prevent this. The decision to have a blood transfusion has been considered carefully by your caregiver before blood is given. Blood is not given unless the benefits outweigh the risks. AFTER THE TRANSFUSION  Right after receiving a blood transfusion, you will usually feel much better and more energetic. This is especially true if your red blood cells have gotten low (anemic). The transfusion raises the  level of the red blood cells which carry oxygen, and this usually causes an energy increase.  The nurse administering the transfusion will monitor you carefully for complications. HOME CARE INSTRUCTIONS  No special instructions are needed after a transfusion. You may find your energy is better. Speak with your caregiver about any limitations on activity for underlying diseases you may have. SEEK MEDICAL CARE IF:   Your condition is not improving after your transfusion.  You develop redness or irritation at the intravenous (IV) site. SEEK IMMEDIATE MEDICAL CARE IF:  Any of the following symptoms occur over the next 12 hours:  Shaking chills.  You have a temperature by mouth above 102 F (38.9 C), not controlled by medicine.  Chest, back, or muscle pain.  People around you feel you are not acting correctly or are confused.  Shortness of breath or difficulty breathing.  Dizziness and fainting.  You get a rash or develop hives.  You have a decrease in urine output.  Your urine turns a dark color or changes to pink, red, or brown. Any of the following symptoms occur over the next 10 days:  You have a temperature by mouth above 102 F (38.9 C), not controlled by medicine.  Shortness of breath.  Weakness after normal activity.  The white part of the eye turns yellow (jaundice).  You have a decrease in the amount of urine or are urinating less often.  Your urine turns a dark color or changes to pink, red, or brown. Document Released: 12/14/2000 Document Revised: 03/10/2012 Document Reviewed: 08/02/2008 ExitCare Patient Information 2014 [place].  _______________________________________________________________________  Incentive Spirometer  An incentive spirometer is a tool that can help keep your lungs clear and active. This tool measures how well you are filling your lungs with each breath. Taking long deep breaths may help reverse or decrease the chance of  developing breathing (pulmonary) problems (especially infection) following:  A long period of time when you are unable to move or be active. BEFORE THE PROCEDURE   If the spirometer includes an indicator to show your best effort, your nurse or respiratory therapist will set it to a desired goal.  If possible, sit up straight or lean slightly forward. Try not to slouch.  Hold the incentive spirometer in an upright position. INSTRUCTIONS FOR USE  3. Sit on the edge of your bed if possible, or sit up as far as you can in bed or on a chair. 4. Hold the incentive spirometer in an upright position. 5. Breathe out normally. 6. Place the mouthpiece in your mouth and seal your lips tightly around it. 7. Breathe in slowly and as deeply as possible, raising the piston or the ball toward the top of the column. 8. Hold your breath for 3-5 seconds or for as long as possible. Allow the piston or ball to fall to the bottom of the column. 9. Remove the mouthpiece from your mouth and breathe out normally. 10. Rest for a few seconds and repeat Steps 1 through 7 at least 10 times every 1-2 hours when you are awake. Take your time and take a few normal breaths between deep breaths. 11. The spirometer may include an indicator to show your best effort. Use the indicator as a goal to work toward during each repetition. 12. After  each set of 10 deep breaths, practice coughing to be sure your lungs are clear. If you have an incision (the cut made at the time of surgery), support your incision when coughing by placing a pillow or rolled up towels firmly against it. Once you are able to get out of bed, walk around indoors and cough well. You may stop using the incentive spirometer when instructed by your caregiver.  RISKS AND COMPLICATIONS  Take your time so you do not get dizzy or light-headed.  If you are in pain, you may need to take or ask for pain medication before doing incentive spirometry. It is harder to take  a deep breath if you are having pain. AFTER USE  Rest and breathe slowly and easily.  It can be helpful to keep track of a log of your progress. Your caregiver can provide you with a simple table to help with this. If you are using the spirometer at home, follow these instructions: [place] IF:   You are having difficultly using the spirometer.  You have trouble using the spirometer as often as instructed.  Your pain medication is not giving enough relief while using the spirometer.  You develop fever of 100.5 F (38.1 C) or higher. SEEK IMMEDIATE MEDICAL CARE IF:   You cough up bloody sputum that had not been present before.  You develop fever of 102 F (38.9 C) or greater.  You develop worsening pain at or near the incision site. MAKE SURE YOU:   Understand these instructions.  Will watch your condition.  Will get help right away if you are not doing well or get worse. Document Released: 04/29/2007 Document Revised: 03/10/2012 Document Reviewed: 06/30/2007 [facility] Patient Information 2014 [place], [place].   ________________________________________________________________________

## 2015-07-20 ENCOUNTER — Encounter (HOSPITAL_COMMUNITY)
Admission: RE | Admit: 2015-07-20 | Discharge: 2015-07-20 | Disposition: A | Discharge status: Home / Self Care | Payer: Medicare HMO | Source: Ambulatory Visit | Attending: Urology | Admitting: Urology

## 2015-07-20 ENCOUNTER — Ambulatory Visit (HOSPITAL_COMMUNITY)
Admission: RE | Admit: 2015-07-20 | Discharge: 2015-07-20 | Disposition: A | Discharge status: Home / Self Care | Payer: Medicare HMO | Source: Ambulatory Visit | Attending: Urology | Admitting: Urology

## 2015-07-20 ENCOUNTER — Encounter (HOSPITAL_COMMUNITY): Payer: Self-pay

## 2015-07-20 DIAGNOSIS — C61 Malignant neoplasm of prostate: Secondary | ICD-10-CM | POA: Insufficient documentation

## 2015-07-20 DIAGNOSIS — Z01818 Encounter for other preprocedural examination: Secondary | ICD-10-CM | POA: Diagnosis not present

## 2015-07-20 HISTORY — DX: Pneumonia, unspecified organism: J18.9

## 2015-07-20 LAB — BASIC METABOLIC PANEL
Anion gap: 7 (ref 5–15)
BUN: 14 mg/dL (ref 6–20)
CHLORIDE: 102 mmol/L (ref 101–111)
CO2: 27 mmol/L (ref 22–32)
Calcium: 9.3 mg/dL (ref 8.9–10.3)
Creatinine, Ser: 1.23 mg/dL (ref 0.61–1.24)
GFR calc Af Amer: >60 mL/min (ref >60)
GFR, EST NON AFRICAN AMERICAN: 58 mL/min — AB (ref >60)
GLUCOSE: 92 mg/dL (ref 65–99)
Potassium: 4.3 mmol/L (ref 3.5–5.1)
SODIUM: 136 mmol/L (ref 135–145)

## 2015-07-20 LAB — CBC
HCT: 38.9 % — ABNORMAL LOW (ref 39.0–52.0)
Hemoglobin: 13.6 g/dL (ref 13.0–17.0)
MCH: 32.3 pg (ref 26.0–34.0)
MCHC: 35 g/dL (ref 30.0–36.0)
MCV: 92.4 fL (ref 78.0–100.0)
PLATELETS: 239 10*3/uL (ref 150–400)
RBC: 4.21 MIL/uL — ABNORMAL LOW (ref 4.22–5.81)
RDW: 11.9 % (ref 11.5–15.5)
WBC: 5.5 10*3/uL (ref 4.0–10.5)

## 2015-07-20 NOTE — Progress Notes (Signed)
EKG- 07/11/2015 on chartr  DR Thurman Coyer - LOv- 07/11/15 on chart

## 2015-07-22 NOTE — H&P (Signed)
Chief Complaint Prostate Cancer     History of Present Illness Johnathan Rodriguez is a 70 year old gentleman who was found to have an elevated PSA of 13.77 prompting a prostate needle biopsy on 06/02/15 that confirmed Gleason 4+3=7 adenocarcinoma of the prostate with 4 out of 12 biopsy cores positive for malignancy. He has no family history of prostate cancer. His urologic history is significant for a history of BPH and LUTS. He previously developed postoperative urinary retention after a THA in March 2015. He has been on alpha blocker therapy in the past. He has also has a history of gross hematuria in 2012 after his CABG. It was felt this was due to trauma from his catheter after a work up by Dr. Gaynelle Arabian.  ** PMH is significant for an MI s/p CABG (followed by Dr. Wynonia Lawman), diabetes, hypertension, hypercholesterolemia, and GERD. He does have longevity in the family with his parents living to be patient ages 42 and 42.  TNM stage: cT1c Nx Mx PSA: 13.77 Gleason score: 4+3=7 Biopsy (06/02/15): 4/12 cores   Left: L apex (10%, 3+3=6)   Right: R apex (80%, 3+4=7), R lateral apex (40%, 3+4=7), R lateral mid (10%, 3+4=7) Prostate volume: 41.5 cc  Nomogram OC disease: 27% EPE: 69% SVI: 10% LNI: 11% PFS (surgery): 51% at 5 years, 36% at 10 years  Urinary function: He has severely bothersome lower urinary tract symptoms including a sense of incomplete emptying, frequency, intermittency, urgency, weak stream, and nocturia. He is treated with alpha-blocker therapy with tamsulosin 0.4 mg. Despite medical therapy, his IPSS is 29. He did develop urinary retention postoperatively after his hip replacement in 2015. Erectile function: He does have moderate to severe erectile dysfunction. SHIM score is 17. However, he states that it is extremely difficult for him to obtain an erection. He has not previously undergone treatment. He and his wife do not consider sexual function a high priority.   Past Medical  History Problems  1. History of Acute Myocardial Infarction 2. History of Asthma (J45.909) 3. History of diabetes mellitus (Z86.39) 4. History of esophageal reflux (Z87.19) 5. History of hypercholesterolemia (Z86.39) 6. History of hypertension (Z86.79)  Surgical History Problems  1. History of Appendectomy 2. History of CABG (CABG) 3. History of Eye Surgery 4. History of Knee Arthroscopy 5. History of Total Hip Replacement 6. History of Total Hip Replacement  His last total hip was in 2015.   Current Meds 1. Aspirin 81 MG TABS;  Therapy: (Recorded:10Jan2012) to Recorded 2. Flaxseed Oil CAPS;  Therapy: (Recorded:19Mar2015) to Recorded 3. Lisinopril 2.5 MG Oral Tablet;  Therapy: (Recorded:10Jan2012) to Recorded 4. MetFORMIN HCl - 500 MG Oral Tablet;  Therapy: (Recorded:10Jan2012) to Recorded 5. Metoprolol Tartrate 50 MG Oral Tablet;  Therapy: (Recorded:19Mar2015) to Recorded 6. Pravastatin Sodium 40 MG Oral Tablet;  Therapy: [phone] to Recorded 7. RA Lansoprazole 15 MG Oral Capsule Delayed Release;  Therapy: (Recorded:19Mar2015) to Recorded 8. Vitamin B-12 TABS;  Therapy: (Recorded:10May2016) to Recorded  Allergies Medication  1. ROPINIRole HCl TABS  Family History Problems  1. Family history of Congestive Heart Failure : Mother 2. Family history of cardiac disorder (Z82.49) : Son 3. Denied: Family history of prostate cancer 4. Family history of Father Deceased At Age ____   80, stroke 5. Family history of Mother Deceased At Age ____   11, CHF 6. Family history of Transient Ischemic Attack : Father  Social History Problems    Denied: History of Alcohol Use   Former smoker [phone])  1 ppd x 50yrs, quit 30yrs ago   Marital History - Currently Married   Occupation: Retired  Review of Systems Genitourinary, constitutional, skin, eye, otolaryngeal, hematologic/lymphatic, cardiovascular, pulmonary, endocrine, musculoskeletal, gastrointestinal,  neurological and psychiatric system(s) were reviewed and pertinent findings if present are noted and are otherwise negative.  Constitutional: no recent weight loss.  Cardiovascular: no chest pain and no leg swelling.  Respiratory: no wheezing and no shortness of breath during exertion.     Physical Exam Constitutional: Well nourished and well developed . No acute distress.  ENT:. The ears and nose are normal in appearance.  Neck: The appearance of the neck is normal and no neck mass is present.  Pulmonary: No respiratory distress, normal respiratory rhythm and effort and clear bilateral breath sounds.  Cardiovascular: Heart rate and rhythm are normal . No peripheral edema.  Abdomen: right lower quadrant incision site(s) well healed. The abdomen is soft and nontender. No masses are palpated. No CVA tenderness. No hernias are palpable. No hepatosplenomegaly noted.   Assessment Assessed  1. Prostate cancer (C61)   Discussion/Summary 1. Prostate cancer: He will undergo a bilateral nerve sparing robot-assisted laparoscopic radical prostatectomy and bilateral pelvic lymphadenectomy.  He is felt to be at low risk for perioperative cardiac complications per Dr. Wynonia Lawman.

## 2015-07-25 ENCOUNTER — Inpatient Hospital Stay (HOSPITAL_COMMUNITY): Payer: Medicare HMO | Admitting: Anesthesiology

## 2015-07-25 ENCOUNTER — Inpatient Hospital Stay (HOSPITAL_COMMUNITY)
Admission: RE | Admit: 2015-07-25 | Discharge: 2015-07-26 | DRG: 708 | Disposition: A | Discharge status: Home / Self Care | Payer: Medicare HMO | Source: Ambulatory Visit | Attending: Urology | Admitting: Urology

## 2015-07-25 ENCOUNTER — Encounter (HOSPITAL_COMMUNITY)
Admission: RE | Disposition: A | Discharge status: Home / Self Care | Payer: Self-pay | Source: Ambulatory Visit | Attending: Urology

## 2015-07-25 ENCOUNTER — Encounter (HOSPITAL_COMMUNITY): Payer: Self-pay | Admitting: *Deleted

## 2015-07-25 DIAGNOSIS — I10 Essential (primary) hypertension: Secondary | ICD-10-CM | POA: Diagnosis present

## 2015-07-25 DIAGNOSIS — J45909 Unspecified asthma, uncomplicated: Secondary | ICD-10-CM | POA: Diagnosis present

## 2015-07-25 DIAGNOSIS — Z951 Presence of aortocoronary bypass graft: Secondary | ICD-10-CM

## 2015-07-25 DIAGNOSIS — Z8546 Personal history of malignant neoplasm of prostate: Secondary | ICD-10-CM

## 2015-07-25 DIAGNOSIS — E780 Pure hypercholesterolemia: Secondary | ICD-10-CM | POA: Diagnosis present

## 2015-07-25 DIAGNOSIS — Z96649 Presence of unspecified artificial hip joint: Secondary | ICD-10-CM | POA: Diagnosis present

## 2015-07-25 DIAGNOSIS — Z888 Allergy status to other drugs, medicaments and biological substances status: Secondary | ICD-10-CM

## 2015-07-25 DIAGNOSIS — C61 Malignant neoplasm of prostate: Secondary | ICD-10-CM | POA: Diagnosis present

## 2015-07-25 DIAGNOSIS — Z9049 Acquired absence of other specified parts of digestive tract: Secondary | ICD-10-CM | POA: Diagnosis present

## 2015-07-25 DIAGNOSIS — I252 Old myocardial infarction: Secondary | ICD-10-CM | POA: Diagnosis not present

## 2015-07-25 DIAGNOSIS — N529 Male erectile dysfunction, unspecified: Secondary | ICD-10-CM | POA: Diagnosis present

## 2015-07-25 DIAGNOSIS — Z87891 Personal history of nicotine dependence: Secondary | ICD-10-CM | POA: Diagnosis not present

## 2015-07-25 DIAGNOSIS — N401 Enlarged prostate with lower urinary tract symptoms: Secondary | ICD-10-CM | POA: Diagnosis present

## 2015-07-25 DIAGNOSIS — K219 Gastro-esophageal reflux disease without esophagitis: Secondary | ICD-10-CM | POA: Diagnosis present

## 2015-07-25 DIAGNOSIS — E119 Type 2 diabetes mellitus without complications: Secondary | ICD-10-CM | POA: Diagnosis present

## 2015-07-25 DIAGNOSIS — Z7982 Long term (current) use of aspirin: Secondary | ICD-10-CM

## 2015-07-25 DIAGNOSIS — R338 Other retention of urine: Secondary | ICD-10-CM | POA: Diagnosis present

## 2015-07-25 HISTORY — PX: ROBOT ASSISTED LAPAROSCOPIC RADICAL PROSTATECTOMY: SHX5141

## 2015-07-25 HISTORY — PX: LYMPHADENECTOMY: SHX5960

## 2015-07-25 LAB — HEMOGLOBIN AND HEMATOCRIT, BLOOD
HEMATOCRIT: 33.7 % — AB (ref 39.0–52.0)
HEMOGLOBIN: 11.9 g/dL — AB (ref 13.0–17.0)

## 2015-07-25 LAB — GLUCOSE, CAPILLARY
GLUCOSE-CAPILLARY: 140 mg/dL — AB (ref 65–99)
Glucose-Capillary: 115 mg/dL — ABNORMAL HIGH (ref 65–99)
Glucose-Capillary: 126 mg/dL — ABNORMAL HIGH (ref 65–99)
Glucose-Capillary: 94 mg/dL (ref 65–99)

## 2015-07-25 SURGERY — ROBOTIC ASSISTED LAPAROSCOPIC RADICAL PROSTATECTOMY LEVEL 2
Anesthesia: General

## 2015-07-25 MED ORDER — HYDROMORPHONE HCL 1 MG/ML IJ SOLN
0.2500 mg | INTRAMUSCULAR | Status: DC | PRN
  Administered 2015-07-25 (×4): 0.5 mg via INTRAVENOUS

## 2015-07-25 MED ORDER — HYDROMORPHONE HCL 1 MG/ML IJ SOLN
INTRAMUSCULAR | Status: AC
  Filled 2015-07-25: qty 1

## 2015-07-25 MED ORDER — LACTATED RINGERS IV SOLN
INTRAVENOUS | Status: DC
  Administered 2015-07-25: 1000 mL via INTRAVENOUS
  Administered 2015-07-25 (×2): via INTRAVENOUS

## 2015-07-25 MED ORDER — MEPERIDINE HCL 50 MG/ML IJ SOLN: 6.2500 mg | INTRAMUSCULAR | Status: DC | PRN

## 2015-07-25 MED ORDER — FENTANYL CITRATE (PF) 100 MCG/2ML IJ SOLN
INTRAMUSCULAR | Status: DC | PRN
  Administered 2015-07-25: 100 ug via INTRAVENOUS
  Administered 2015-07-25: 50 ug via INTRAVENOUS

## 2015-07-25 MED ORDER — STERILE WATER FOR IRRIGATION IR SOLN
Status: DC | PRN
  Administered 2015-07-25: 1500 mL

## 2015-07-25 MED ORDER — ROCURONIUM BROMIDE 100 MG/10ML IV SOLN
INTRAVENOUS | Status: AC
  Filled 2015-07-25: qty 1

## 2015-07-25 MED ORDER — CIPROFLOXACIN IN D5W 400 MG/200ML IV SOLN
400.0000 mg | INTRAVENOUS | Status: AC
  Administered 2015-07-25: 400 mg via INTRAVENOUS

## 2015-07-25 MED ORDER — FENTANYL CITRATE (PF) 100 MCG/2ML IJ SOLN
25.0000 ug | INTRAMUSCULAR | Status: DC | PRN
  Administered 2015-07-25 (×3): 50 ug via INTRAVENOUS

## 2015-07-25 MED ORDER — PANTOPRAZOLE SODIUM 40 MG PO TBEC
40.0000 mg | DELAYED_RELEASE_TABLET | ORAL | Status: AC
  Administered 2015-07-25: 40 mg via ORAL
  Filled 2015-07-25: qty 1

## 2015-07-25 MED ORDER — SODIUM CHLORIDE 0.9 % IV BOLUS (SEPSIS)
1000.0000 mL | Freq: Once | INTRAVENOUS | Status: AC
  Administered 2015-07-25: 1000 mL via INTRAVENOUS

## 2015-07-25 MED ORDER — ONDANSETRON HCL 4 MG/2ML IJ SOLN
INTRAMUSCULAR | Status: AC
  Filled 2015-07-25: qty 2

## 2015-07-25 MED ORDER — PROMETHAZINE HCL 25 MG/ML IJ SOLN
INTRAMUSCULAR | Status: AC
  Filled 2015-07-25: qty 1

## 2015-07-25 MED ORDER — GLYCOPYRROLATE 0.2 MG/ML IJ SOLN
INTRAMUSCULAR | Status: AC
  Filled 2015-07-25: qty 3

## 2015-07-25 MED ORDER — HYDROCODONE-ACETAMINOPHEN 5-325 MG PO TABS: 1.0000 | ORAL_TABLET | Freq: Four times a day (QID) | ORAL | Status: AC | PRN

## 2015-07-25 MED ORDER — DOCUSATE SODIUM 100 MG PO CAPS
100.0000 mg | ORAL_CAPSULE | Freq: Two times a day (BID) | ORAL | Status: DC
  Administered 2015-07-25 – 2015-07-26 (×2): 100 mg via ORAL
  Filled 2015-07-25 (×3): qty 1

## 2015-07-25 MED ORDER — MORPHINE SULFATE 2 MG/ML IJ SOLN
2.0000 mg | INTRAMUSCULAR | Status: DC | PRN
  Administered 2015-07-25: 4 mg via INTRAVENOUS
  Administered 2015-07-25 – 2015-07-26 (×3): 2 mg via INTRAVENOUS
  Filled 2015-07-25: qty 2
  Filled 2015-07-25: qty 1
  Filled 2015-07-25: qty 2
  Filled 2015-07-25: qty 1

## 2015-07-25 MED ORDER — LIDOCAINE HCL (CARDIAC) 20 MG/ML IV SOLN
INTRAVENOUS | Status: DC | PRN
  Administered 2015-07-25 (×2): 50 mg via INTRAVENOUS

## 2015-07-25 MED ORDER — CEFAZOLIN SODIUM-DEXTROSE 2-3 GM-% IV SOLR
INTRAVENOUS | Status: AC
  Filled 2015-07-25: qty 50

## 2015-07-25 MED ORDER — DIPHENHYDRAMINE HCL 50 MG/ML IJ SOLN: 12.5000 mg | Freq: Four times a day (QID) | INTRAMUSCULAR | Status: DC | PRN

## 2015-07-25 MED ORDER — BUPIVACAINE-EPINEPHRINE (PF) 0.25% -1:200000 IJ SOLN
INTRAMUSCULAR | Status: AC
  Filled 2015-07-25: qty 30

## 2015-07-25 MED ORDER — PRAVASTATIN SODIUM 40 MG PO TABS
40.0000 mg | ORAL_TABLET | Freq: Every evening | ORAL | Status: DC
  Administered 2015-07-25: 40 mg via ORAL
  Filled 2015-07-25 (×3): qty 1

## 2015-07-25 MED ORDER — METOPROLOL TARTRATE 25 MG PO TABS
25.0000 mg | ORAL_TABLET | Freq: Two times a day (BID) | ORAL | Status: DC
  Administered 2015-07-25 – 2015-07-26 (×2): 25 mg via ORAL
  Filled 2015-07-25 (×2): qty 1

## 2015-07-25 MED ORDER — LACTATED RINGERS IV SOLN
INTRAVENOUS | Status: DC | PRN
  Administered 2015-07-25: 1000 mL

## 2015-07-25 MED ORDER — PROPOFOL 10 MG/ML IV BOLUS
INTRAVENOUS | Status: DC | PRN
  Administered 2015-07-25: 150 mg via INTRAVENOUS

## 2015-07-25 MED ORDER — ONDANSETRON HCL 4 MG/2ML IJ SOLN
INTRAMUSCULAR | Status: DC | PRN
  Administered 2015-07-25: 4 mg via INTRAVENOUS

## 2015-07-25 MED ORDER — MIDAZOLAM HCL 5 MG/5ML IJ SOLN
INTRAMUSCULAR | Status: DC | PRN
  Administered 2015-07-25: 2 mg via INTRAVENOUS

## 2015-07-25 MED ORDER — POTASSIUM CHLORIDE IN NACL 20-0.45 MEQ/L-% IV SOLN
INTRAVENOUS | Status: DC
  Administered 2015-07-25 – 2015-07-26 (×2): via INTRAVENOUS
  Filled 2015-07-25 (×4): qty 1000

## 2015-07-25 MED ORDER — LIDOCAINE HCL (CARDIAC) 20 MG/ML IV SOLN
INTRAVENOUS | Status: AC
  Filled 2015-07-25: qty 5

## 2015-07-25 MED ORDER — CEFAZOLIN SODIUM-DEXTROSE 2-3 GM-% IV SOLR
2.0000 g | INTRAVENOUS | Status: AC
  Administered 2015-07-25: 2 g via INTRAVENOUS

## 2015-07-25 MED ORDER — SULFAMETHOXAZOLE-TRIMETHOPRIM 800-160 MG PO TABS: 1.0000 | ORAL_TABLET | Freq: Two times a day (BID) | ORAL | Status: DC

## 2015-07-25 MED ORDER — INSULIN ASPART 100 UNIT/ML ~~LOC~~ SOLN
0.0000 [IU] | SUBCUTANEOUS | Status: DC
  Administered 2015-07-25: 2 [IU] via SUBCUTANEOUS

## 2015-07-25 MED ORDER — NEOSTIGMINE METHYLSULFATE 10 MG/10ML IV SOLN
INTRAVENOUS | Status: AC
  Filled 2015-07-25: qty 1

## 2015-07-25 MED ORDER — PANTOPRAZOLE SODIUM 20 MG PO TBEC: 20.0000 mg | DELAYED_RELEASE_TABLET | Freq: Every day | ORAL | Status: DC

## 2015-07-25 MED ORDER — FENTANYL CITRATE (PF) 100 MCG/2ML IJ SOLN
INTRAMUSCULAR | Status: AC
  Filled 2015-07-25: qty 2

## 2015-07-25 MED ORDER — SODIUM CHLORIDE 0.9 % IR SOLN
Status: DC | PRN
  Administered 2015-07-25: 1000 mL via INTRAVESICAL

## 2015-07-25 MED ORDER — PROPOFOL 10 MG/ML IV BOLUS
INTRAVENOUS | Status: AC
  Filled 2015-07-25: qty 20

## 2015-07-25 MED ORDER — ROCURONIUM BROMIDE 100 MG/10ML IV SOLN
INTRAVENOUS | Status: DC | PRN
  Administered 2015-07-25: 50 mg via INTRAVENOUS
  Administered 2015-07-25: 10 mg via INTRAVENOUS

## 2015-07-25 MED ORDER — PANTOPRAZOLE SODIUM 20 MG PO TBEC
20.0000 mg | DELAYED_RELEASE_TABLET | Freq: Every day | ORAL | Status: DC
  Administered 2015-07-25 – 2015-07-26 (×2): 20 mg via ORAL
  Filled 2015-07-25 (×2): qty 1

## 2015-07-25 MED ORDER — MIDAZOLAM HCL 2 MG/2ML IJ SOLN
INTRAMUSCULAR | Status: AC
  Filled 2015-07-25: qty 2

## 2015-07-25 MED ORDER — DIPHENHYDRAMINE HCL 12.5 MG/5ML PO ELIX: 12.5000 mg | ORAL_SOLUTION | Freq: Four times a day (QID) | ORAL | Status: DC | PRN

## 2015-07-25 MED ORDER — KETOROLAC TROMETHAMINE 15 MG/ML IJ SOLN
15.0000 mg | Freq: Four times a day (QID) | INTRAMUSCULAR | Status: DC
Start: 2015-07-25 — End: 2015-07-26
  Administered 2015-07-25 – 2015-07-26 (×5): 15 mg via INTRAVENOUS
  Filled 2015-07-25 (×7): qty 1

## 2015-07-25 MED ORDER — CIPROFLOXACIN IN D5W 400 MG/200ML IV SOLN
INTRAVENOUS | Status: AC
  Filled 2015-07-25: qty 200

## 2015-07-25 MED ORDER — BUPIVACAINE-EPINEPHRINE 0.25% -1:200000 IJ SOLN
INTRAMUSCULAR | Status: DC | PRN
  Administered 2015-07-25: 30 mL

## 2015-07-25 MED ORDER — ACETAMINOPHEN 325 MG PO TABS: 650.0000 mg | ORAL_TABLET | ORAL | Status: DC | PRN

## 2015-07-25 MED ORDER — EPHEDRINE SULFATE 50 MG/ML IJ SOLN
INTRAMUSCULAR | Status: DC | PRN
  Administered 2015-07-25: 5 mg via INTRAVENOUS
  Administered 2015-07-25 (×2): 10 mg via INTRAVENOUS
  Administered 2015-07-25: 5 mg via INTRAVENOUS

## 2015-07-25 MED ORDER — FENTANYL CITRATE (PF) 100 MCG/2ML IJ SOLN
INTRAMUSCULAR | Status: AC
Start: 2015-07-25 — End: 2015-07-26
  Filled 2015-07-25: qty 2

## 2015-07-25 MED ORDER — FENTANYL CITRATE (PF) 250 MCG/5ML IJ SOLN
INTRAMUSCULAR | Status: AC
  Filled 2015-07-25: qty 5

## 2015-07-25 MED ORDER — KETOROLAC TROMETHAMINE 15 MG/ML IJ SOLN
INTRAMUSCULAR | Status: AC
  Filled 2015-07-25: qty 1

## 2015-07-25 MED ORDER — CEFAZOLIN SODIUM 1-5 GM-% IV SOLN
1.0000 g | Freq: Three times a day (TID) | INTRAVENOUS | Status: AC
  Administered 2015-07-25 – 2015-07-26 (×2): 1 g via INTRAVENOUS
  Filled 2015-07-25 (×2): qty 50

## 2015-07-25 MED ORDER — NEOSTIGMINE METHYLSULFATE 10 MG/10ML IV SOLN
INTRAVENOUS | Status: DC | PRN
  Administered 2015-07-25: 4 mg via INTRAVENOUS

## 2015-07-25 MED ORDER — PROMETHAZINE HCL 25 MG/ML IJ SOLN
6.2500 mg | INTRAMUSCULAR | Status: DC | PRN
  Administered 2015-07-25: 6.25 mg via INTRAVENOUS

## 2015-07-25 MED ORDER — HEPARIN SODIUM (PORCINE) 1000 UNIT/ML IJ SOLN
INTRAMUSCULAR | Status: AC
  Filled 2015-07-25: qty 1

## 2015-07-25 MED ORDER — GLYCOPYRROLATE 0.2 MG/ML IJ SOLN
INTRAMUSCULAR | Status: DC | PRN
  Administered 2015-07-25: 0.6 mg via INTRAVENOUS

## 2015-07-25 SURGICAL SUPPLY — 51 items
CABLE HIGH FREQUENCY MONO STRZ (ELECTRODE) ×4 IMPLANT
CATH FOLEY 2WAY SLVR 18FR 30CC (CATHETERS) ×4 IMPLANT
CATH ROBINSON RED A/P 16FR (CATHETERS) ×4 IMPLANT
CATH ROBINSON RED A/P 8FR (CATHETERS) ×4 IMPLANT
CATH TIEMANN FOLEY 18FR 5CC (CATHETERS) ×4 IMPLANT
CHLORAPREP W/TINT 26ML (MISCELLANEOUS) ×4 IMPLANT
CLIP LIGATING HEM O LOK PURPLE (MISCELLANEOUS) ×10 IMPLANT
CLOTH BEACON ORANGE TIMEOUT ST (SAFETY) ×4 IMPLANT
COVER SURGICAL LIGHT HANDLE (MISCELLANEOUS) ×4 IMPLANT
COVER TIP SHEARS 8 DVNC (MISCELLANEOUS) ×2 IMPLANT
COVER TIP SHEARS 8MM DA VINCI (MISCELLANEOUS) ×4
CUTTER ECHEON FLEX ENDO 45 340 (ENDOMECHANICALS) ×4 IMPLANT
DECANTER SPIKE VIAL GLASS SM (MISCELLANEOUS) ×2 IMPLANT
DRAPE SURG IRRIG POUCH 19X23 (DRAPES) ×4 IMPLANT
DRSG TEGADERM 4X4.75 (GAUZE/BANDAGES/DRESSINGS) ×4 IMPLANT
DRSG TEGADERM 6X8 (GAUZE/BANDAGES/DRESSINGS) ×4 IMPLANT
ELECT REM PT RETURN 9FT ADLT (ELECTROSURGICAL) ×4
ELECTRODE REM PT RTRN 9FT ADLT (ELECTROSURGICAL) ×2 IMPLANT
GLOVE BIO SURGEON STRL SZ 6.5 (GLOVE) ×3 IMPLANT
GLOVE BIO SURGEONS STRL SZ 6.5 (GLOVE) ×1
GLOVE BIOGEL M STRL SZ7.5 (GLOVE) ×8 IMPLANT
GOWN STRL REUS W/TWL LRG LVL3 (GOWN DISPOSABLE) ×12 IMPLANT
HOLDER FOLEY CATH W/STRAP (MISCELLANEOUS) ×4 IMPLANT
IV LACTATED RINGERS 1000ML (IV SOLUTION) ×2 IMPLANT
KIT ACCESSORY DA VINCI DISP (KITS) ×2
KIT ACCESSORY DVNC DISP (KITS) ×2 IMPLANT
LIQUID BAND (GAUZE/BANDAGES/DRESSINGS) ×2 IMPLANT
MANIFOLD NEPTUNE II (INSTRUMENTS) ×4 IMPLANT
NDL SAFETY ECLIPSE 18X1.5 (NEEDLE) ×2 IMPLANT
NEEDLE HYPO 18GX1.5 SHARP (NEEDLE) ×4
PACK ROBOT UROLOGY CUSTOM (CUSTOM PROCEDURE TRAY) ×4 IMPLANT
RELOAD GREEN ECHELON 45 (STAPLE) ×4 IMPLANT
SET TUBE IRRIG SUCTION NO TIP (IRRIGATION / IRRIGATOR) ×4 IMPLANT
SHEET LAVH (DRAPES) ×2 IMPLANT
SLEEVE SURGEON STRL (DRAPES) ×2 IMPLANT
SOLUTION ELECTROLUBE (MISCELLANEOUS) ×4 IMPLANT
SUT ETHILON 3 0 PS 1 (SUTURE) ×4 IMPLANT
SUT MNCRL 3 0 RB1 (SUTURE) ×2 IMPLANT
SUT MNCRL 3 0 VIOLET RB1 (SUTURE) ×2 IMPLANT
SUT MNCRL AB 4-0 PS2 18 (SUTURE) ×8 IMPLANT
SUT MONOCRYL 3 0 RB1 (SUTURE) ×4
SUT VIC AB 0 CT1 27 (SUTURE) ×4
SUT VIC AB 0 CT1 27XBRD ANTBC (SUTURE) ×2 IMPLANT
SUT VIC AB 0 UR5 27 (SUTURE) ×4 IMPLANT
SUT VIC AB 2-0 SH 27 (SUTURE) ×4
SUT VIC AB 2-0 SH 27X BRD (SUTURE) ×2 IMPLANT
SUT VICRYL 0 UR6 27IN ABS (SUTURE) ×8 IMPLANT
SYR 27GX1/2 1ML LL SAFETY (SYRINGE) ×4 IMPLANT
TOWEL OR 17X26 10 PK STRL BLUE (TOWEL DISPOSABLE) ×4 IMPLANT
TOWEL OR NON WOVEN STRL DISP B (DISPOSABLE) ×4 IMPLANT
WATER STERILE IRR 1500ML POUR (IV SOLUTION) ×6 IMPLANT

## 2015-07-25 NOTE — Op Note (Signed)

## 2015-07-25 NOTE — Anesthesia Preprocedure Evaluation (Addendum)
Anesthesia Evaluation  Patient identified by MRN, date of birth, ID band Patient awake    Reviewed: Allergy & Precautions, NPO status , Patient's Chart, lab work & pertinent test results  History of Anesthesia Complications (+) PONV and history of anesthetic complications  Airway Mallampati: II  TM Distance: >3 FB Neck ROM: Full    Dental no notable dental hx. (+)    Pulmonary asthma , pneumonia -, resolved, former smoker,  breath sounds clear to auscultation  Pulmonary exam normal       Cardiovascular Exercise Tolerance: Good hypertension, Pt. on medications and Pt. on home beta blockers + CAD and + Past MI Normal cardiovascular examRhythm:Regular Rate:Normal  ECHO 2011: EF 50-55%   Neuro/Psych negative neurological ROS  negative psych ROS   GI/Hepatic Neg liver ROS, GERD-  Medicated,  Endo/Other  diabetes, Type 2, Oral Hypoglycemic Agents  Renal/GU negative Renal ROS  negative genitourinary   Musculoskeletal  (+) Arthritis -,   Abdominal   Peds negative pediatric ROS (+)  Hematology negative hematology ROS (+)   Anesthesia Other Findings   Reproductive/Obstetrics negative OB ROS                           Anesthesia Physical Anesthesia Plan  ASA: III  Anesthesia Plan: General   Post-op Pain Management:    Induction: Intravenous  Airway Management Planned: Oral ETT  Additional Equipment:   Intra-op Plan:   Post-operative Plan: Extubation in OR  Informed Consent: I have reviewed the patients History and Physical, chart, labs and discussed the procedure including the risks, benefits and alternatives for the proposed anesthesia with the patient or authorized representative who has indicated his/her understanding and acceptance.   Dental advisory given  Plan Discussed with: CRNA  Anesthesia Plan Comments:         Anesthesia Quick Evaluation

## 2015-07-25 NOTE — Progress Notes (Signed)
Utilization review completed.  

## 2015-07-25 NOTE — Progress Notes (Signed)
Patient ID: Johnathan Rodriguez, male   DOB: 03-16-1945, 70 y.o.   MRN: [id number]  Post-op note  Subjective: The patient is doing well.  No complaints.  Objective: Vital signs in last 24 hours: Temp:  [97.8 F (36.6 C)-98 F (36.7 C)] 98 F (36.7 C) (07/25 1627) Pulse Rate:  [61-80] 62 (07/25 1627) Resp:  [12-17] 15 (07/25 1627) BP: (104-134)/(53-82) 134/82 mmHg (07/25 1627) SpO2:  [98 %-100 %] 100 % (07/25 1627) Weight:  [86.637 kg (191 lb)] 86.637 kg (191 lb) (07/25 0858)  Intake/Output from previous day:   Intake/Output this shift: Total I/O In: 4500 [I.V.:3500; IV Piggyback:1000] Out: 240 [Urine:50; Drains:90; Blood:100]  Physical Exam:  General: Alert and oriented. Abdomen: Soft, Nondistended. Incisions: Clean and dry.  Lab Results:  Recent Labs  07/25/15 1435  HGB 11.9*  HCT 33.7*    Assessment/Plan: POD#0   1) Continue to monitor, ambulate, IS   Pryor Curia. MD   LOS: 0 days   Zola Runion,LES 07/25/2015, 5:27 PM

## 2015-07-25 NOTE — Anesthesia Procedure Notes (Signed)
Procedure Name: Intubation Date/Time: 07/25/2015 10:59 AM Performed by: Glory Buff Pre-anesthesia Checklist: Patient identified, Emergency Drugs available, Suction available and Patient being monitored Patient Re-evaluated:Patient Re-evaluated prior to inductionOxygen Delivery Method: Circle System Utilized Preoxygenation: Pre-oxygenation with 100% oxygen Intubation Type: IV induction Ventilation: Mask ventilation without difficulty Laryngoscope Size: Miller and 3 Grade View: Grade I Tube type: Oral Tube size: 7.5 mm Number of attempts: 1 Airway Equipment and Method: Stylet and Oral airway Placement Confirmation: ETT inserted through vocal cords under direct vision,  positive ETCO2 and breath sounds checked- equal and bilateral Secured at: 21 cm Tube secured with: Tape Dental Injury: Teeth and Oropharynx as per pre-operative assessment

## 2015-07-25 NOTE — Discharge Instructions (Signed)

## 2015-07-25 NOTE — Transfer of Care (Signed)
Immediate Anesthesia Transfer of Care Note  Patient: Johnathan Rodriguez  Procedure(s) Performed: Procedure(s): ROBOTIC ASSISTED LAPAROSCOPIC RADICAL PROSTATECTOMY LEVEL 2 (N/A) PELVIC LYMPHADENECTOMY (Bilateral)  Patient Location: PACU  Anesthesia Type:General  Level of Consciousness: awake, alert  and oriented  Airway & Oxygen Therapy: Patient Spontanous Breathing and Patient connected to face mask oxygen  Post-op Assessment: Report given to RN and Post -op Vital signs reviewed and stable  Post vital signs: Reviewed and stable  Last Vitals:  Filed Vitals:   07/25/15 0850  BP: 119/77  Pulse: 68  Temp: 36.6 C  Resp: 16    Complications: No apparent anesthesia complications

## 2015-07-26 ENCOUNTER — Encounter (HOSPITAL_COMMUNITY): Payer: Self-pay | Admitting: Urology

## 2015-07-26 LAB — TYPE AND SCREEN
ABO/RH(D): O NEG
ANTIBODY SCREEN: NEGATIVE

## 2015-07-26 LAB — HEMOGLOBIN AND HEMATOCRIT, BLOOD
HEMATOCRIT: 35 % — AB (ref 39.0–52.0)
Hemoglobin: 11.9 g/dL — ABNORMAL LOW (ref 13.0–17.0)

## 2015-07-26 LAB — GLUCOSE, CAPILLARY
Glucose-Capillary: 91 mg/dL (ref 65–99)
Glucose-Capillary: 90 mg/dL (ref 65–99)
Glucose-Capillary: 98 mg/dL (ref 65–99)

## 2015-07-26 MED ORDER — BISACODYL 10 MG RE SUPP
10.0000 mg | Freq: Once | RECTAL | Status: AC
  Administered 2015-07-26: 10 mg via RECTAL
  Filled 2015-07-26: qty 1

## 2015-07-26 MED ORDER — HYDROCODONE-ACETAMINOPHEN 5-325 MG PO TABS
1.0000 | ORAL_TABLET | Freq: Four times a day (QID) | ORAL | Status: DC | PRN
  Administered 2015-07-26: 2 via ORAL
  Filled 2015-07-26: qty 2

## 2015-07-26 NOTE — Anesthesia Postprocedure Evaluation (Signed)
  Anesthesia Post-op Note  Patient: Johnathan Rodriguez  Procedure(s) Performed: Procedure(s): ROBOTIC ASSISTED LAPAROSCOPIC RADICAL PROSTATECTOMY LEVEL 2 (N/A) PELVIC LYMPHADENECTOMY (Bilateral)  Patient Location: PACU  Anesthesia Type:General  Level of Consciousness: awake  Airway and Oxygen Therapy: Patient Spontanous Breathing  Post-op Pain: mild  Post-op Assessment: Post-op Vital signs reviewed, Patient's Cardiovascular Status Stable, Respiratory Function Stable, Patent Airway, No signs of Nausea or vomiting and Pain level controlled              Post-op Vital Signs: Reviewed and stable  Last Vitals:  Filed Vitals:   07/26/15 0353  BP: 103/58  Pulse: 67  Temp: 36.8 C  Resp: 16    Complications: No apparent anesthesia complications

## 2015-07-26 NOTE — Discharge Summary (Signed)
Date of admission: 07/25/2015  Date of discharge: 07/26/2015  Admission diagnosis: Prostate Cancer  Discharge diagnosis: Prostate Cancer  Secondary diagnoses:   History and Physical: For full details, please see admission history and physical. Briefly, Johnathan Rodriguez is a 70 y.o. year old patient with intermediate risk prostate cancer noted on TRUS biopsy on 06/02/15 and PSA of 13.77 ng/dL. After thorough counseling he has elected to undergo robot-assisted laparoscopic radical prostatectomy with bilateral pelvic lymph node dissection.  Hospital Course:   Prostate cancer-patient underwent an uncomplicated robot-assisted laparoscopic radical prostatectomy with bilateral pelvic lymph node dissection 07/25/15. He did well post-operatively and was transferred to the post-surgical floor. His diet was slowly advanced to clear liquids, hemodynamic, laboratory of JP drain outputs and parameters. JP drain ws removed the morning of POD1. Foley catheter was draining clear, yellow urine. Foley teaching was completed. He was discharged home the morning of POD1 in good condition. He will follow-up as scheduled for TOV and path review.  Laboratory values:  Recent Labs  07/25/15 1435 07/26/15 0540  HGB 11.9* 11.9*  HCT 33.7* 35.0*   No results for input(s): CREATININE in the last 72 hours.  Disposition: Home  Discharge instruction: The patient was instructed to be ambulatory but told to refrain from heavy lifting, strenuous activity, or driving.   1. Activity:  You are encouraged to ambulate frequently (about every hour during waking hours) to help prevent blood clots from forming in your legs or lungs.  However, you should not engage in any heavy lifting (> 10-15 lbs), strenuous activity, or straining. 2. Diet: You should continue a clear liquid diet until passing gas from below.  Once this occurs, you may advance your diet to a soft diet that would be easy to digest (i.e soups, scrambled eggs, mashed  potatoes, etc.) for 24 hours just as you would if getting over a bad stomach flu.  If tolerating this diet well for 24 hours, you may then begin eating regular food.  It will be normal to have some amount of bloating, nausea, and abdominal discomfort intermittently. 3. Prescriptions:  You will be provided a prescription for pain medication to take as needed.  If your pain is not severe enough to require the prescription pain medication, you may take extra strength Tylenol instead.  You should also take an over the counter stool softener (Colace 100 mg twice daily) to avoid straining with bowel movements as the pain medication may constipate you. Finally, you will also be provided a prescription for an antibiotic to begin the day prior to your return visit in the office for catheter removal. 4. Catheter care: You will be taught how to take care of the catheter by the nursing staff prior to discharge from the hospital.  You may use both a leg bag and the larger bedside bag but it is recommended to at least use the bigger bedside bag at nighttime as the leg bag is small and will fill up overnight and also does not drain as well when lying flat. You may periodically feel a strong urge to void with the catheter in place.  This is a bladder spasm and most often can occur when having a bowel movement or when you are moving around. It is typically self-limited and usually will stop after a few minutes.  You may use some Vaseline or Neosporin around the tip of the catheter to reduce friction at the tip of the penis. 5. Incisions: You may remove your dressing bandages  the 2nd day after surgery.  You most likely will have a few small staples in each of the incisions and once the bandages are removed, the incisions may stay open to air.  You may start showering (not soaking or bathing in water) 48 hours after surgery and the incisions simply need to be patted dry after the shower.  No additional care is needed. 6. What to  call us about: You should call the office [phone]) if you develop fever > 101, persistent vomiting, or the catheter stops draining. Also, feel free to call with any other questions you may have and remember the handout that was provided to you as a reference preoperatively which answers many of the common questions that arise after surgery.   Discharge medications:    Medication List    STOP taking these medications        aspirin 81 MG tablet     FLAX SEED OIL PO     tamsulosin 0.4 MG Caps capsule  Commonly known as:  FLOMAX     VITAMIN B 12 PO      TAKE these medications        HYDROcodone-acetaminophen 5-325 MG per tablet  Commonly known as:  NORCO  Take 1-2 tablets by mouth every 6 (six) hours as needed.     lansoprazole 15 MG capsule  Commonly known as:  PREVACID  Take 15 mg by mouth daily at 12 noon.     lisinopril 2.5 MG tablet  Commonly known as:  PRINIVIL,ZESTRIL  Take 2.5 mg by mouth daily with breakfast.     metFORMIN 500 MG tablet  Commonly known as:  GLUCOPHAGE  Take 500 mg by mouth 2 (two) times daily with a meal.     metoprolol 50 MG tablet  Commonly known as:  LOPRESSOR  Take 25 mg by mouth 2 (two) times daily. Takes 1/2 pill BID     pravastatin 40 MG tablet  Commonly known as:  PRAVACHOL  Take 40 mg by mouth every evening.     sulfamethoxazole-trimethoprim 800-160 MG per tablet  Commonly known as:  BACTRIM DS,SEPTRA DS  Take 1 tablet by mouth 2 (two) times daily. Start the day prior to foley removal appointment        Followup:      Follow-up Information    Follow up with [facility], NP On 08/02/2015.   Specialty:  Urology   Why:  at 10:30   Contact information:   [place] [address] [phone]

## 2015-07-26 NOTE — Progress Notes (Signed)
1 Day Post-Op Subjective: The patient is doing well.  No nausea or vomiting. Pain is adequately controlled. Ambulated in hall last night.  Objective: Vital signs in last 24 hours: Temp:  [97.5 F (36.4 C)-98.2 F (36.8 C)] 98.2 F (36.8 C) (07/26 0353) Pulse Rate:  [61-82] 67 (07/26 0353) Resp:  [12-17] 16 (07/26 0353) BP: (103-153)/(53-114) 103/58 mmHg (07/26 0353) SpO2:  [93 %-100 %] 93 % (07/26 0353) Weight:  [86.637 kg (191 lb)] 86.637 kg (191 lb) (07/25 0858)  Intake/Output from previous day: 07/25 0701 - 07/26 0700 In: 6552.5 [I.V.:5452.5; IV Piggyback:1100] Out: 2170 [Urine:1850; Drains:220; Blood:100] Intake/Output this shift: Total I/O In: 1852.5 [I.V.:1802.5; IV Piggyback:50] Out: 1800 [Urine:1700; Drains:100]  Physical Exam:  General: Alert and oriented. CV: RRR Lungs: Clear bilaterally. GI: Soft, Nondistended. ATTP. Incisions: Clean, dry, and intact, JP secure with serosang drainage Urine: Clear Extremities: Nontender, no erythema, no edema.  Lab Results:  Recent Labs  07/25/15 1435 07/26/15 0540  HGB 11.9* 11.9*  HCT 33.7* 35.0*      Assessment/Plan: POD# 1 s/p robotic prostatectomy. Doing well on expected post-operative course.  1) SL IVF 2) Ambulate, Incentive spirometry 3) Transition to oral pain medication 4) Dulcolax suppository 5) D/C pelvic drain 6) Plan for likely discharge later today    LOS: 1 day   Star Age 07/26/2015, 6:43 AM

## 2015-07-26 NOTE — Care Management Note (Signed)
Case Management Note  Patient Details  Name: Johnathan Rodriguez MRN: [id number] Date of Birth: 1945/04/26  Subjective/Objective:69 y/o m admitted w/Prostate Ca. S/p prostatectomy. From home.                    Action/Plan:d/c plan home.No d/c needs.   Expected Discharge Date:                  Expected Discharge Plan:  Home/Self Care  In-House Referral:     Discharge planning Services  CM Consult  Post Acute Care Choice:    Choice offered to:     DME Arranged:    DME Agency:     HH Arranged:    [place] Agency:     Status of Service:  Completed, signed off  Medicare Important Message Given:    Date Medicare IM Given:    Medicare IM give by:    Date Additional Medicare IM Given:    Additional Medicare Important Message give by:     If discussed at [place] of Stay Meetings, dates discussed:    Additional Comments:  Dessa Phi, RN 07/26/2015, 12:45 PM

## 2015-08-02 ENCOUNTER — Encounter (HOSPITAL_COMMUNITY): Payer: Self-pay

## 2015-08-02 ENCOUNTER — Emergency Department (HOSPITAL_COMMUNITY)
Admission: EM | Admit: 2015-08-02 | Discharge: 2015-08-02 | Disposition: A | Discharge status: Home / Self Care | Payer: Medicare HMO | Attending: Emergency Medicine | Admitting: Emergency Medicine

## 2015-08-02 DIAGNOSIS — Z8701 Personal history of pneumonia (recurrent): Secondary | ICD-10-CM | POA: Diagnosis not present

## 2015-08-02 DIAGNOSIS — R339 Retention of urine, unspecified: Secondary | ICD-10-CM | POA: Diagnosis present

## 2015-08-02 DIAGNOSIS — I10 Essential (primary) hypertension: Secondary | ICD-10-CM | POA: Diagnosis not present

## 2015-08-02 DIAGNOSIS — Z87891 Personal history of nicotine dependence: Secondary | ICD-10-CM | POA: Diagnosis not present

## 2015-08-02 DIAGNOSIS — M1990 Unspecified osteoarthritis, unspecified site: Secondary | ICD-10-CM | POA: Diagnosis not present

## 2015-08-02 DIAGNOSIS — Z85828 Personal history of other malignant neoplasm of skin: Secondary | ICD-10-CM | POA: Insufficient documentation

## 2015-08-02 DIAGNOSIS — I252 Old myocardial infarction: Secondary | ICD-10-CM | POA: Insufficient documentation

## 2015-08-02 DIAGNOSIS — K219 Gastro-esophageal reflux disease without esophagitis: Secondary | ICD-10-CM | POA: Diagnosis not present

## 2015-08-02 DIAGNOSIS — Z8546 Personal history of malignant neoplasm of prostate: Secondary | ICD-10-CM | POA: Insufficient documentation

## 2015-08-02 DIAGNOSIS — E119 Type 2 diabetes mellitus without complications: Secondary | ICD-10-CM | POA: Diagnosis not present

## 2015-08-02 DIAGNOSIS — I2510 Atherosclerotic heart disease of native coronary artery without angina pectoris: Secondary | ICD-10-CM | POA: Diagnosis not present

## 2015-08-02 DIAGNOSIS — J45909 Unspecified asthma, uncomplicated: Secondary | ICD-10-CM | POA: Diagnosis not present

## 2015-08-02 DIAGNOSIS — Z951 Presence of aortocoronary bypass graft: Secondary | ICD-10-CM | POA: Diagnosis not present

## 2015-08-02 MED ORDER — CIPROFLOXACIN HCL 500 MG PO TABS
500.0000 mg | ORAL_TABLET | Freq: Once | ORAL | Status: AC
  Administered 2015-08-02: 500 mg via ORAL
  Filled 2015-08-02: qty 1

## 2015-08-02 NOTE — Discharge Instructions (Signed)
Acute Urinary Retention °Acute urinary retention is the temporary inability to urinate. °This is a common problem in older men. As men age their prostates become larger and block the flow of urine from the bladder. This is usually a problem that has come on gradually.  °HOME CARE INSTRUCTIONS °If you are sent home with a Foley catheter and a drainage system, you will need to discuss the best course of action with your health care provider. While the catheter is in, maintain a good intake of fluids. Keep the drainage bag emptied and lower than your catheter. This is so that contaminated urine will not flow back into your bladder, which could lead to a urinary tract infection. °There are two main types of drainage bags. One is a large bag that usually is used at night. It has a good capacity that will allow you to sleep through the night without having to empty it. The second type is called a leg bag. It has a smaller capacity, so it needs to be emptied more frequently. However, the main advantage is that it can be attached by a leg strap and can go underneath your clothing, allowing you the freedom to move about or leave your home. °Only take over-the-counter or prescription medicines for pain, discomfort, or fever as directed by your health care provider.  °SEEK MEDICAL CARE IF: °· You develop a low-grade fever. °· You experience spasms or leakage of urine with the spasms. °SEEK IMMEDIATE MEDICAL CARE IF:  °· You develop chills or fever. °· Your catheter stops draining urine. °· Your catheter falls out. °· You start to develop increased bleeding that does not respond to rest and increased fluid intake. °MAKE SURE YOU: °· Understand these instructions. °· Will watch your condition. °· Will get help right away if you are not doing well or get worse. °Document Released: 03/25/2001 Document Revised: 12/22/2013 Document Reviewed: 05/28/2013 °Exit[employer]® Patient Information ©2015 [employer]. This information is not  intended to replace advice given to you by your health care provider. Make sure you discuss any questions you have with your health care provider. ° °

## 2015-08-02 NOTE — ED Provider Notes (Signed)
CSN: [id number]     Arrival date & time 08/02/15  0524 History   None    Chief Complaint  Patient presents with  . Urinary Retention     (Consider location/radiation/quality/duration/timing/severity/associated sxs/prior Treatment) HPI Comments: Patient presents to the ER for evaluation of urinary retention. Patient had prostatectomy performed on July 25. He had a Foley catheter in place until earlier today. Catheter was removed in the office today, patient reports that he has not been able to urinate since.   Past Medical History  Diagnosis Date  . PONV (postoperative nausea and vomiting)   . Hypertension   . Coronary artery disease     CABG 2012  . Elevated cholesterol   . Asthma     NO PROBLEMS SINCE 2012  . Arthritis   . Neuropathy     FEET  . Cancer     SKIN CANCER  . GERD (gastroesophageal reflux disease)   . Constipation   . Diabetes mellitus   . Myocardial infarction 12/10/2010  . Prostate cancer 06/10/15  . Nocturia   . Hx of gastric ulcer   . Pneumonia     hx of    Past Surgical History  Procedure Laterality Date  . Knee arthroscopy      BIL- right knee arthroscopy x 2   . Appendectomy    . Eye surgery    . Total hip arthroplasty  05/19/2012    Procedure: TOTAL HIP ARTHROPLASTY;  Surgeon: [name], MD;  Location: [facility];  Service: Orthopedics;  Laterality: Right;  . Coronary artery bypass graft  12/13/10    4 VESSELS  DEC 2012  . Total hip arthroplasty Left 03/08/2014    Procedure: LEFT TOTAL HIP ARTHROPLASTY;  Surgeon: [name], MD;  Location: [facility];  Service: Orthopedics;  Laterality: Left;  . Robot assisted laparoscopic radical prostatectomy N/A 07/25/2015    Procedure: ROBOTIC ASSISTED LAPAROSCOPIC RADICAL PROSTATECTOMY LEVEL 2;  Surgeon: Raynelle Bring, MD;  Location: [facility];  Service: Urology;  Laterality: N/A;  . Lymphadenectomy Bilateral 07/25/2015    Procedure: PELVIC LYMPHADENECTOMY;  Surgeon: Raynelle Bring, MD;  Location: [facility];   Service: Urology;  Laterality: Bilateral;   Family History  Problem Relation Age of Onset  . Diverticulosis Sister   . Congestive Heart Failure Mother   . Other Son     Cardiac Disorder  . Heart attack Father    History  Substance Use Topics  . Smoking status: Former Smoker -- 1.00 packs/day for 6 years    Types: Cigarettes    Quit date: 05/09/1987  . Smokeless tobacco: Former Systems developer    Types: Chew  . Alcohol Use: No    Review of Systems  Genitourinary: Positive for decreased urine volume.  All other systems reviewed and are negative.     Allergies  Codeine; Other; and Ropinirole  Home Medications   Prior to Admission medications   Medication Sig Start Date End Date Taking? Authorizing Provider  HYDROcodone-acetaminophen (NORCO) 5-325 MG per tablet Take 1-2 tablets by mouth every 6 (six) hours as needed. 07/25/15   Debbrah Alar, PA-C  lansoprazole (PREVACID) 15 MG capsule Take 15 mg by mouth daily at 12 noon.    Historical Provider, MD  lisinopril (PRINIVIL,ZESTRIL) 2.5 MG tablet Take 2.5 mg by mouth daily with breakfast.    Historical Provider, MD  metFORMIN (GLUCOPHAGE) 500 MG tablet Take 500 mg by mouth 2 (two) times daily with a meal.    Historical Provider, MD  metoprolol (LOPRESSOR) 50 MG tablet Take 25 mg by mouth 2 (two) times daily. Takes 1/2 pill BID    Historical Provider, MD  pravastatin (PRAVACHOL) 40 MG tablet Take 40 mg by mouth every evening.     Historical Provider, MD  sulfamethoxazole-trimethoprim (BACTRIM DS,SEPTRA DS) 800-160 MG per tablet Take 1 tablet by mouth 2 (two) times daily. Start the day prior to foley removal appointment 07/25/15   Debbrah Alar, PA-C   There were no vitals taken for this visit. Physical Exam  Constitutional: He is oriented to person, place, and time. He appears well-developed and well-nourished. No distress.  HENT:  Head: Normocephalic and atraumatic.  Right Ear: Hearing normal.  Left Ear: Hearing normal.  Nose: Nose normal.   Mouth/Throat: Oropharynx is clear and moist and mucous membranes are normal.  Eyes: Conjunctivae and EOM are normal. Pupils are equal, round, and reactive to light.  Neck: Normal range of motion. Neck supple.  Cardiovascular: Regular rhythm, S1 normal and S2 normal.  Exam reveals no gallop and no friction rub.   No murmur heard. Pulmonary/Chest: Effort normal and breath sounds normal. No respiratory distress. He exhibits no tenderness.  Abdominal: Soft. Normal appearance and bowel sounds are normal. There is no hepatosplenomegaly. There is tenderness in the suprapubic area. There is no rebound, no guarding, no tenderness at McBurney's point and negative Murphy's sign. No hernia.  Musculoskeletal: Normal range of motion.  Neurological: He is alert and oriented to person, place, and time. He has normal strength. No cranial nerve deficit or sensory deficit. Coordination normal. GCS eye subscore is 4. GCS verbal subscore is 5. GCS motor subscore is 6.  Skin: Skin is warm, dry and intact. No rash noted. No cyanosis.  Psychiatric: He has a normal mood and affect. His speech is normal and behavior is normal. Thought content normal.  Nursing note and vitals reviewed.   ED Course  Procedures (including critical care time) Labs Review Labs Reviewed - No data to display  Imaging Review No results found.   EKG Interpretation None      MDM   Final diagnoses:  None   urinary retention  Patient is one-week status post prostatectomy, presents to the ER with urinary retention. Dr. Ottis Stain, urology, had called before the patient arrived and asked that he be the one to place catheter. Urology cart has been obtained, Dr. Ottis Stain to see the patient for Foley catheter placement.    Orpah Greek, MD 08/02/15 [phone]

## 2015-08-02 NOTE — Consult Note (Signed)
Urology Consult   Physician requesting consult: Joseph Berkshire, MD  Reason for consult: Urinary Retention  History of Present Illness: Johnathan Rodriguez is a 70 y.o. gentleman with history of prostate cancer who underwent RALP with BPLND on 07/25/15. His post-operative course was uncomplicated and he returned for his TOV nurse visit the morning of 08/01/15. He passed the fill and pull and then went home and was unable to urinate anything for over 16 hours. He called me in excruciating pain and I instructed him to come to the [facility] ED. Bladder scan demonstrated >500 cc of urine in the bladder, suprapubic fullness and obvious discomfort.  He has had issues with voiding post-operatively in the passed which were attributed to his prostate and had improved on flomax. He is a diabetic with neuropathy which may also be affecting his detrusor contractility  Past Medical History  Diagnosis Date  . PONV (postoperative nausea and vomiting)   . Hypertension   . Coronary artery disease     CABG 2012  . Elevated cholesterol   . Asthma     NO PROBLEMS SINCE 2012  . Arthritis   . Neuropathy     FEET  . Cancer     SKIN CANCER  . GERD (gastroesophageal reflux disease)   . Constipation   . Diabetes mellitus   . Myocardial infarction 12/10/2010  . Prostate cancer 06/10/15  . Nocturia   . Hx of gastric ulcer   . Pneumonia     hx of     Past Surgical History  Procedure Laterality Date  . Knee arthroscopy      BIL- right knee arthroscopy x 2   . Appendectomy    . Eye surgery    . Total hip arthroplasty  05/19/2012    Procedure: TOTAL HIP ARTHROPLASTY;  Surgeon: [name], MD;  Location: [facility];  Service: Orthopedics;  Laterality: Right;  . Coronary artery bypass graft  12/13/10    4 VESSELS  DEC 2012  . Total hip arthroplasty Left 03/08/2014    Procedure: LEFT TOTAL HIP ARTHROPLASTY;  Surgeon: [name], MD;  Location: [facility];  Service: Orthopedics;  Laterality: Left;  . Robot  assisted laparoscopic radical prostatectomy N/A 07/25/2015    Procedure: ROBOTIC ASSISTED LAPAROSCOPIC RADICAL PROSTATECTOMY LEVEL 2;  Surgeon: Raynelle Bring, MD;  Location: [facility];  Service: Urology;  Laterality: N/A;  . Lymphadenectomy Bilateral 07/25/2015    Procedure: PELVIC LYMPHADENECTOMY;  Surgeon: Raynelle Bring, MD;  Location: [facility];  Service: Urology;  Laterality: Bilateral;    Current Hospital Medications:  Home Meds:    Medication List    ASK your doctor about these medications        HYDROcodone-acetaminophen 5-325 MG per tablet  Commonly known as:  NORCO  Take 1-2 tablets by mouth every 6 (six) hours as needed.     lansoprazole 15 MG capsule  Commonly known as:  PREVACID  Take 15 mg by mouth daily at 12 noon.     lisinopril 2.5 MG tablet  Commonly known as:  PRINIVIL,ZESTRIL  Take 2.5 mg by mouth daily with breakfast.     metFORMIN 500 MG tablet  Commonly known as:  GLUCOPHAGE  Take 500 mg by mouth 2 (two) times daily with a meal.     metoprolol 50 MG tablet  Commonly known as:  LOPRESSOR  Take 25 mg by mouth 2 (two) times daily. Takes 1/2 pill BID     pravastatin 40 MG tablet  Commonly known as:  PRAVACHOL  Take 40 mg by mouth every evening.     sulfamethoxazole-trimethoprim 800-160 MG per tablet  Commonly known as:  BACTRIM DS,SEPTRA DS  Take 1 tablet by mouth 2 (two) times daily. Start the day prior to foley removal appointment        Scheduled Meds: . ciprofloxacin  500 mg Oral Once   Continuous Infusions:  PRN Meds:.  Allergies:  Allergies  Allergen Reactions  . Codeine Nausea And Vomiting  . Other Nausea And Vomiting    Patient states that he is allergic to a heart medication and it causes vomiting  . Ropinirole Nausea And Vomiting    Family History  Problem Relation Age of Onset  . Diverticulosis Sister   . Congestive Heart Failure Mother   . Other Son     Cardiac Disorder  . Heart attack Father     Social History:  reports that  he quit smoking about 28 years ago. His smoking use included Cigarettes. He has a 6 pack-year smoking history. He has quit using smokeless tobacco. His smokeless tobacco use included Chew. He reports that he does not drink alcohol or use illicit drugs.  ROS: A complete review of systems was performed.  All systems are negative except for pertinent findings as noted.  Physical Exam:  Vital signs in last 24 hours: Temp:  [97.8 F (36.6 C)] 97.8 F (36.6 C) (08/02 0545) Pulse Rate:  [76] 76 (08/02 0545) Resp:  [18] 18 (08/02 0545) BP: (109)/(61) 109/61 mmHg (08/02 0545) SpO2:  [94 %] 94 % (08/02 0545) Constitutional:  Alert and oriented, No acute distress Cardiovascular: Regular rate and rhythm, No JVD Respiratory: Normal respiratory effort, Lungs clear bilaterally GI: Abdomen is soft, nontender, nondistended, no abdominal masses GU: No CVA tenderness Lymphatic: No lymphadenopathy Neurologic: Grossly intact, no focal deficits Psychiatric: Normal mood and affect  Laboratory Data:  No results for input(s): WBC, HGB, HCT, PLT in the last 72 hours.  No results for input(s): NA, K, CL, GLUCOSE, BUN, CALCIUM, CREATININE in the last 72 hours.  Invalid input(s): CO3   No results found for this or any previous visit (from the past 24 hour(s)). No results found for this or any previous visit (from the past 240 hour(s)).  Renal Function: No results for input(s): CREATININE in the last 168 hours. Estimated Creatinine Clearance: 57.7 mL/min (by C-G formula based on Cr of 1.23).  Radiologic Imaging: No results found.  I independently reviewed the above imaging studies.  Procedures: After the patient was prepped and draped in the usual sterile fashion, flexible cystourethroscopy was performed.  This revealed a normal anterior urethra. On approach of the anastomosis a dense area of fibrinous tissue was encountered which was traversed with relative ease with the scope an the bladder was  enetered Systematic examination of the bladder revealed no evidence for tumors, stones, or other mucosal pathology. No staples, clips or other foreign bodies were noted on antegrade inspection of the anastomosis. A sensor wire was passed into the bladder, the scope was removed and a 16 Fr council catheter was easily placed into the bladder with immediate return of clear, yellow urine. 20 cc of sterile water were placed in the balloon port.  Impression/Recommendation  42yM with pT2bN0 adenocarcinoma of the prostate s/p RALP with BPLND 07/25/15 who presents in urinary retention following successful TOV 15 hours prior.  Flexible cystoscopy demonstrated a small area of fibrous tissue at the anastomosis that was easily bypassed with the  cystoscope and was no longer visualized on antegrade look. No other foreign objects or strictures were noted. Anastomosis intact. Successful placement of 16 Fr council catheter over a Sensor wire. 20 cc sterile water in balloon port.  -Follow-up as scheduled with Dr. Alinda Money Wednesday 08/10/15  Star Age 08/02/2015, 6:53 AM

## 2015-08-02 NOTE — ED Notes (Signed)
Pt had catheter removed Monday morning and hasn't urinated since. Urology called to see pt in ED

## 2015-08-02 NOTE — ED Notes (Signed)
Urology at bedside.

## 2015-11-20 IMAGING — CR DG CHEST 2V
2 series · 2 of 2 positions shown · non-contrast
Comparison: 03/05/2014.

CLINICAL DATA: Preop prostatectomy.

EXAM:
CHEST  2 VIEW

[w chest pa]
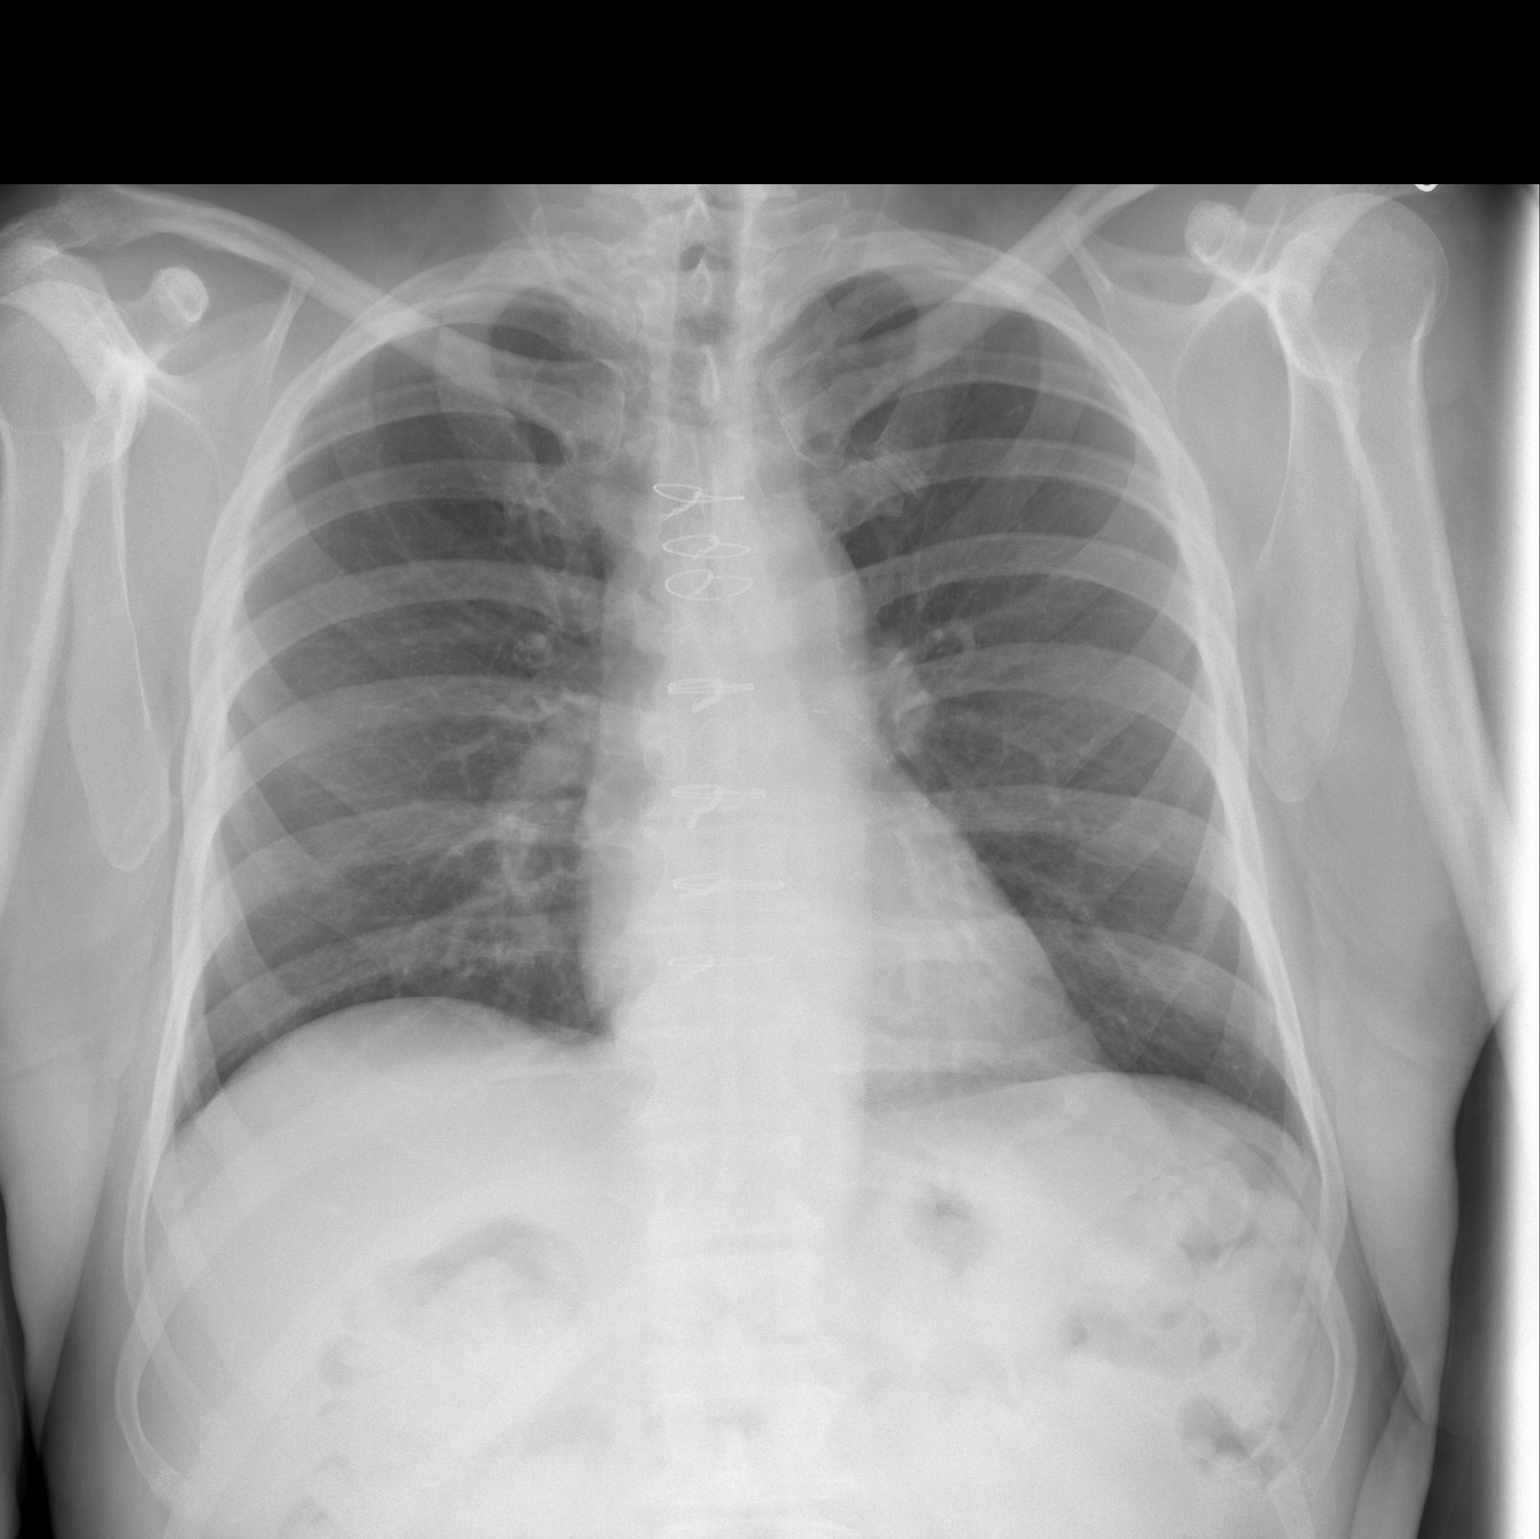

[w chest lat]
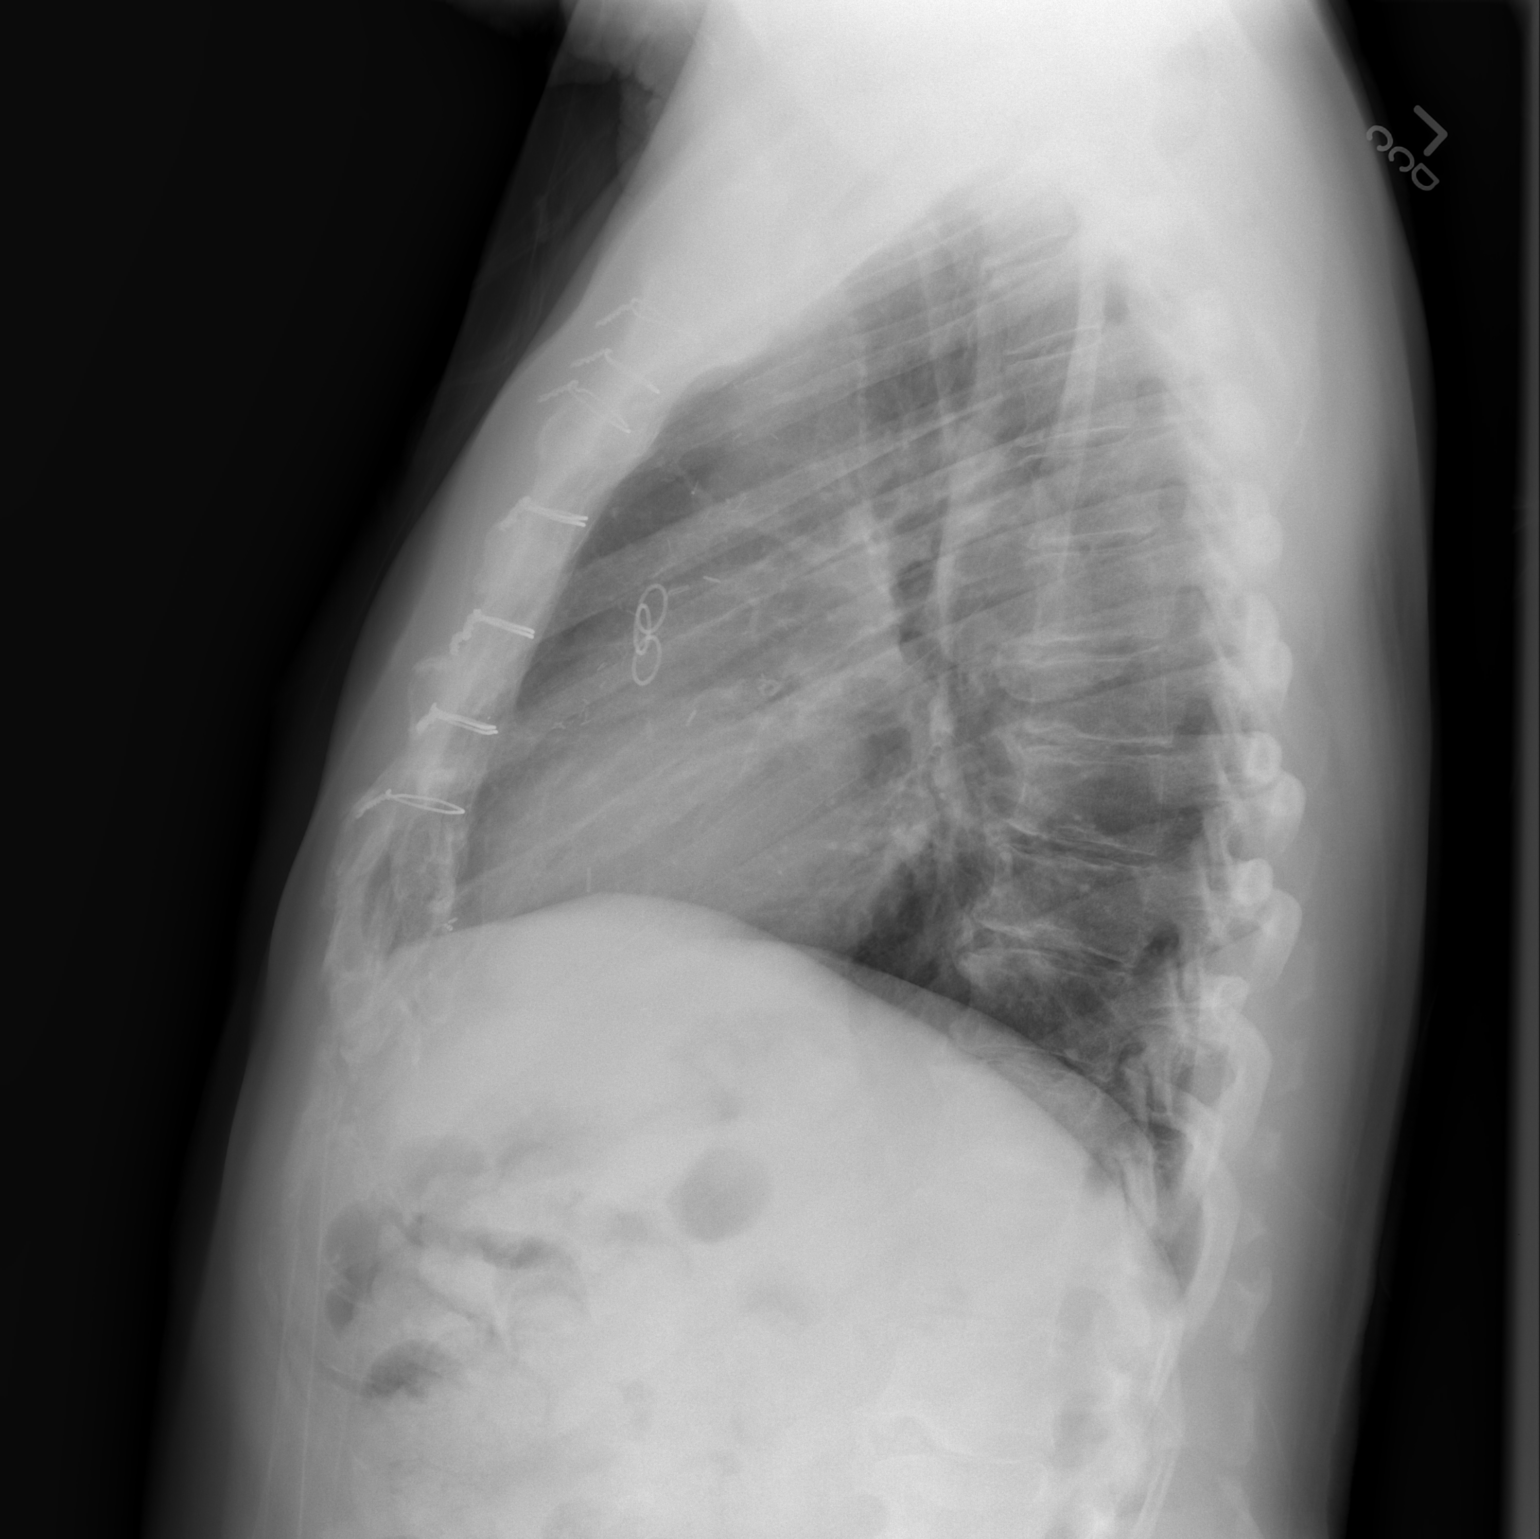

[2 of 2 positions shown; findings below may reference images not displayed]

FINDINGS: Trachea is midline. Heart size normal. Lungs are clear. No pleural
fluid.
IMPRESSION: No acute findings.

## 2018-01-12 ENCOUNTER — Encounter (HOSPITAL_COMMUNITY): Payer: Self-pay | Admitting: Internal Medicine

## 2018-01-12 ENCOUNTER — Emergency Department (HOSPITAL_COMMUNITY)
Admission: EM | Admit: 2018-01-12 | Discharge: 2018-01-12 | Disposition: A | Discharge status: Home / Self Care | Payer: Medicare HMO | Source: Home / Self Care | Attending: Emergency Medicine | Admitting: Emergency Medicine

## 2018-01-12 ENCOUNTER — Other Ambulatory Visit: Payer: Self-pay

## 2018-01-12 ENCOUNTER — Emergency Department (HOSPITAL_COMMUNITY): Payer: Medicare HMO

## 2018-01-12 ENCOUNTER — Emergency Department (HOSPITAL_COMMUNITY)
Admission: EM | Admit: 2018-01-12 | Discharge: 2018-01-12 | Disposition: A | Discharge status: Home / Self Care | Payer: Medicare HMO | Attending: Emergency Medicine | Admitting: Emergency Medicine

## 2018-01-12 DIAGNOSIS — Z85828 Personal history of other malignant neoplasm of skin: Secondary | ICD-10-CM | POA: Insufficient documentation

## 2018-01-12 DIAGNOSIS — I10 Essential (primary) hypertension: Secondary | ICD-10-CM | POA: Diagnosis not present

## 2018-01-12 DIAGNOSIS — Z7982 Long term (current) use of aspirin: Secondary | ICD-10-CM | POA: Insufficient documentation

## 2018-01-12 DIAGNOSIS — Z96643 Presence of artificial hip joint, bilateral: Secondary | ICD-10-CM | POA: Diagnosis not present

## 2018-01-12 DIAGNOSIS — E119 Type 2 diabetes mellitus without complications: Secondary | ICD-10-CM | POA: Insufficient documentation

## 2018-01-12 DIAGNOSIS — Z79899 Other long term (current) drug therapy: Secondary | ICD-10-CM | POA: Insufficient documentation

## 2018-01-12 DIAGNOSIS — J45909 Unspecified asthma, uncomplicated: Secondary | ICD-10-CM | POA: Diagnosis not present

## 2018-01-12 DIAGNOSIS — Z87891 Personal history of nicotine dependence: Secondary | ICD-10-CM

## 2018-01-12 DIAGNOSIS — R109 Unspecified abdominal pain: Secondary | ICD-10-CM | POA: Diagnosis present

## 2018-01-12 DIAGNOSIS — Z7984 Long term (current) use of oral hypoglycemic drugs: Secondary | ICD-10-CM | POA: Insufficient documentation

## 2018-01-12 DIAGNOSIS — M546 Pain in thoracic spine: Secondary | ICD-10-CM | POA: Diagnosis not present

## 2018-01-12 DIAGNOSIS — M62830 Muscle spasm of back: Secondary | ICD-10-CM | POA: Insufficient documentation

## 2018-01-12 DIAGNOSIS — Z8546 Personal history of malignant neoplasm of prostate: Secondary | ICD-10-CM

## 2018-01-12 DIAGNOSIS — E1140 Type 2 diabetes mellitus with diabetic neuropathy, unspecified: Secondary | ICD-10-CM | POA: Diagnosis not present

## 2018-01-12 DIAGNOSIS — I2510 Atherosclerotic heart disease of native coronary artery without angina pectoris: Secondary | ICD-10-CM | POA: Insufficient documentation

## 2018-01-12 DIAGNOSIS — Z951 Presence of aortocoronary bypass graft: Secondary | ICD-10-CM | POA: Insufficient documentation

## 2018-01-12 DIAGNOSIS — M62838 Other muscle spasm: Secondary | ICD-10-CM

## 2018-01-12 LAB — URINALYSIS, ROUTINE W REFLEX MICROSCOPIC
Bilirubin Urine: NEGATIVE
Glucose, UA: 150 mg/dL — AB
Hgb urine dipstick: NEGATIVE
Ketones, ur: NEGATIVE mg/dL
Leukocytes, UA: NEGATIVE
Nitrite: NEGATIVE
Protein, ur: NEGATIVE mg/dL
Specific Gravity, Urine: 1.025 (ref 1.005–1.030)
pH: 5 (ref 5.0–8.0)

## 2018-01-12 LAB — BASIC METABOLIC PANEL
Anion gap: 8 (ref 5–15)
BUN: 24 mg/dL — ABNORMAL HIGH (ref 6–20)
CO2: 31 mmol/L (ref 22–32)
Calcium: 8.9 mg/dL (ref 8.9–10.3)
Chloride: 94 mmol/L — ABNORMAL LOW (ref 101–111)
Creatinine, Ser: 1.08 mg/dL (ref 0.61–1.24)
GFR calc Af Amer: >60 mL/min (ref >60)
GFR calc non Af Amer: >60 mL/min (ref >60)
Glucose, Bld: 184 mg/dL — ABNORMAL HIGH (ref 65–99)
Potassium: 4 mmol/L (ref 3.5–5.1)
Sodium: 133 mmol/L — ABNORMAL LOW (ref 135–145)

## 2018-01-12 LAB — CBC WITH DIFFERENTIAL/PLATELET
Basophils Absolute: 0 10*3/uL (ref 0.0–0.1)
Basophils Relative: 0 %
Eosinophils Absolute: 0.2 10*3/uL (ref 0.0–0.7)
Eosinophils Relative: 2 %
HCT: 42.4 % (ref 39.0–52.0)
Hemoglobin: 15 g/dL (ref 13.0–17.0)
Lymphocytes Relative: 17 %
Lymphs Abs: 2.2 10*3/uL (ref 0.7–4.0)
MCH: 33.9 pg (ref 26.0–34.0)
MCHC: 35.4 g/dL (ref 30.0–36.0)
MCV: 95.7 fL (ref 78.0–100.0)
Monocytes Absolute: 1.3 10*3/uL — ABNORMAL HIGH (ref 0.1–1.0)
Monocytes Relative: 10 %
Neutro Abs: 8.9 10*3/uL — ABNORMAL HIGH (ref 1.7–7.7)
Neutrophils Relative %: 71 %
Platelets: 228 10*3/uL (ref 150–400)
RBC: 4.43 MIL/uL (ref 4.22–5.81)
RDW: 12.7 % (ref 11.5–15.5)
WBC: 12.6 10*3/uL — ABNORMAL HIGH (ref 4.0–10.5)

## 2018-01-12 MED ORDER — OXYCODONE-ACETAMINOPHEN 5-325 MG PO TABS: 1.0000 | ORAL_TABLET | Freq: Four times a day (QID) | ORAL | 0 refills | Status: AC | PRN

## 2018-01-12 MED ORDER — DIAZEPAM 2 MG PO TABS: 2.0000 mg | ORAL_TABLET | Freq: Two times a day (BID) | ORAL | 0 refills | Status: AC

## 2018-01-12 MED ORDER — SODIUM CHLORIDE 0.9 % IV BOLUS (SEPSIS)
500.0000 mL | Freq: Once | INTRAVENOUS | Status: AC
  Administered 2018-01-12: 500 mL via INTRAVENOUS

## 2018-01-12 MED ORDER — OXYCODONE-ACETAMINOPHEN 5-325 MG PO TABS
1.0000 | ORAL_TABLET | Freq: Once | ORAL | Status: AC
  Administered 2018-01-12: 1 via ORAL
  Filled 2018-01-12: qty 1

## 2018-01-12 MED ORDER — DIAZEPAM 5 MG/ML IJ SOLN
5.0000 mg | Freq: Once | INTRAMUSCULAR | Status: AC
  Administered 2018-01-12: 5 mg via INTRAMUSCULAR
  Filled 2018-01-12: qty 2

## 2018-01-12 MED ORDER — DIAZEPAM 5 MG PO TABS: 2.5000 mg | ORAL_TABLET | Freq: Four times a day (QID) | ORAL | 0 refills | Status: DC | PRN

## 2018-01-12 MED ORDER — HYDROMORPHONE HCL 1 MG/ML IJ SOLN
0.7500 mg | Freq: Once | INTRAMUSCULAR | Status: AC
  Administered 2018-01-12: 0.75 mg via INTRAVENOUS
  Filled 2018-01-12: qty 1

## 2018-01-12 MED ORDER — HYDROMORPHONE HCL 1 MG/ML IJ SOLN
0.5000 mg | Freq: Once | INTRAMUSCULAR | Status: AC
  Administered 2018-01-12: 0.5 mg via INTRAMUSCULAR
  Filled 2018-01-12: qty 1

## 2018-01-12 MED ORDER — KETOROLAC TROMETHAMINE 15 MG/ML IJ SOLN
15.0000 mg | Freq: Once | INTRAMUSCULAR | Status: AC
  Administered 2018-01-12: 15 mg via INTRAVENOUS
  Filled 2018-01-12: qty 1

## 2018-01-12 MED ORDER — HYDROMORPHONE HCL 1 MG/ML IJ SOLN
0.5000 mg | Freq: Once | INTRAMUSCULAR | Status: AC
  Administered 2018-01-12: 0.5 mg via INTRAVENOUS
  Filled 2018-01-12: qty 1

## 2018-01-12 NOTE — ED Notes (Signed)
Called lab to have them run labs STAT. Labs will result shortly.

## 2018-01-12 NOTE — ED Provider Notes (Signed)
Medical screening examination/treatment/procedure(s) were conducted as a shared visit with non-physician practitioner(s) and myself.  I personally evaluated the patient during the encounter.   EKG Interpretation None     73 year old male presents with 3 weeks of left flank pain characterizes spasms.  Seen earlier today for similar symptoms and that workup was reviewed including a renal CT as well as urinalysis.  On exam here he is pinpoint tender at his left costal margin.  He denies any shortness of breath.  Suspect muscular skeletal pain but will treat and return precautions given   Lacretia Leigh, MD 01/12/18 2154

## 2018-01-12 NOTE — Discharge Instructions (Addendum)
You do not have a urinary tract infection.  The CT of the abdomen pelvis looks fine as well.  We do not see any obvious explanation for your pain based on the imaging.  I suspect that this might be musculoskeletal in nature.

## 2018-01-12 NOTE — ED Notes (Signed)
EDP at bedside  

## 2018-01-12 NOTE — ED Provider Notes (Signed)
[facility] DEPT Provider Note   CSN: [id number] Arrival date & time: 01/12/18  1959     History   Chief Complaint No chief complaint on file.   HPI Johnathan Rodriguez is a 73 y.o. male who presents for evaluation of left-sided back pain that has been intermittently worsening for the last 3 weeks.  Patient reports that pain began approximately 3 weeks ago.  No preceding trauma, injury, fall.  Has been able to ambulate.  Went to his primary care doctor initially with pain and was prescribed prednisone muscle relaxer.  He states that he took those medications with no improvement.  He came to the emergency department today for worsening pain.  At that time, UA and CT renal scan was negative for any signs of kidney stone.  Patient was given Toradol Dilaudid and discharged home.  Patient reports at that time he got home he was having worsening pain, prompting repeat visit.  Patient denies any fever, urinary incontinence, chest pain, dizziness, numbness/weakness of arms or legs, fevers, weight loss, night sweats.  The history is provided by the patient.    Past Medical History:  Diagnosis Date  . Arthritis   . Asthma    NO PROBLEMS SINCE 2012  . Cancer [facility])    SKIN CANCER  . Constipation   . Coronary artery disease    CABG 2012  . Diabetes mellitus   . Elevated cholesterol   . GERD (gastroesophageal reflux disease)   . Hx of gastric ulcer   . Hypertension   . Myocardial infarction ([place]) 12/10/2010  . Neuropathy    FEET  . Nocturia   . Pneumonia    hx of   . PONV (postoperative nausea and vomiting)   . Prostate cancer ([place]) 06/10/15    Patient Active Problem List   Diagnosis Date Noted  . Prostate cancer ([place]) 07/25/2015  . Malignant neoplasm of prostate ([place]) 06/23/2015  . Urinary retention 03/11/2014  . Unspecified essential hypertension 03/09/2014  . Pure hypercholesterolemia 03/09/2014  . GERD (gastroesophageal reflux disease) 03/09/2014    . Volume overload postop  03/09/2014  . CAD (coronary artery disease) 03/04/2014  . Postop Hyponatremia 05/20/2012  . OA (osteoarthritis) of hip 05/19/2012    Past Surgical History:  Procedure Laterality Date  . APPENDECTOMY    . CORONARY ARTERY BYPASS GRAFT  12/13/10   4 VESSELS  DEC 2012  . EYE SURGERY    . KNEE ARTHROSCOPY     BIL- right knee arthroscopy x 2   . LYMPHADENECTOMY Bilateral 07/25/2015   Procedure: PELVIC LYMPHADENECTOMY;  Surgeon: Raynelle Bring, MD;  Location: [facility];  Service: Urology;  Laterality: Bilateral;  . ROBOT ASSISTED LAPAROSCOPIC RADICAL PROSTATECTOMY N/A 07/25/2015   Procedure: ROBOTIC ASSISTED LAPAROSCOPIC RADICAL PROSTATECTOMY LEVEL 2;  Surgeon: Raynelle Bring, MD;  Location: [facility];  Service: Urology;  Laterality: N/A;  . TOTAL HIP ARTHROPLASTY  05/19/2012   Procedure: TOTAL HIP ARTHROPLASTY;  Surgeon: [name], MD;  Location: [facility];  Service: Orthopedics;  Laterality: Right;  . TOTAL HIP ARTHROPLASTY Left 03/08/2014   Procedure: LEFT TOTAL HIP ARTHROPLASTY;  Surgeon: [name], MD;  Location: [facility];  Service: Orthopedics;  Laterality: Left;       Home Medications    Prior to Admission medications   Medication Sig Start Date End Date Taking? Authorizing Provider  aspirin 81 MG tablet Take 81 mg by mouth at bedtime.   Yes [provider]  Cholecalciferol (VITAMIN D3)  400 units CAPS Take 400 Units by mouth daily.   Yes [provider]  cyclobenzaprine (FLEXERIL) 10 MG tablet Take 10 mg by mouth daily as needed for muscle spasms.  01/03/18 01/13/18 Yes [provider]  Flaxseed, Linseed, (FLAXSEED OIL) 1200 MG CAPS Take 2 capsules by mouth daily.   Yes [provider]  lisinopril (PRINIVIL,ZESTRIL) 2.5 MG tablet Take 2.5 mg by mouth daily with breakfast.   Yes [provider]  metFORMIN (GLUCOPHAGE) 500 MG tablet Take 500 mg by mouth 2 (two) times daily with a meal.   Yes [provider]   metoprolol (LOPRESSOR) 50 MG tablet Take 25 mg by mouth 2 (two) times daily. Takes 1/2 pill BID   Yes [provider]  omeprazole (PRILOSEC) 20 MG capsule Take 20 mg by mouth every other day. 01/03/18 01/03/19 Yes [provider]  pravastatin (PRAVACHOL) 40 MG tablet Take 40 mg by mouth every evening.    Yes [provider]  predniSONE (DELTASONE) 20 MG tablet Take 20 mg by mouth daily with breakfast. Take 3 (60mg ) tabs by mouth for 3 days, then 2 tabs (40) by mouth for 3 days, then 1 tab (20mg ) by mouth for 3 days, then 1/2 tab (10mg ) by mouth for three days.   Yes [provider]  ranitidine (ZANTAC) 150 MG tablet Take 150 mg by mouth 2 (two) times daily. 12/17/17  Yes [provider]  vitamin B-12 (CYANOCOBALAMIN) 1000 MCG tablet Take 1,000 mcg by mouth daily.   Yes [provider]  vitamin E (VITAMIN E) 400 UNIT capsule Take 400 Units by mouth daily.   Yes [provider]  diazepam (VALIUM) 2 MG tablet Take 1 tablet (2 mg total) by mouth 2 (two) times daily for 7 days. 01/12/18 01/19/18  Volanda Napoleon, PA-C  HYDROcodone-acetaminophen (NORCO) 5-325 MG per tablet Take 1-2 tablets by mouth every 6 (six) hours as needed. Patient not taking: Reported on 01/12/2018 07/25/15   Debbrah Alar, PA-C  oxyCODONE-acetaminophen (PERCOCET/ROXICET) 5-325 MG tablet Take 1-2 tablets by mouth every 6 (six) hours as needed for severe pain. 01/12/18   Volanda Napoleon, PA-C    Family History Family History  Problem Relation Age of Onset  . Diverticulosis Sister   . Congestive Heart Failure Mother   . Other Son        Cardiac Disorder  . Heart attack Father     Social History Social History   Tobacco Use  . Smoking status: Former Smoker    Packs/day: 1.00    Years: 6.00    Pack years: 6.00    Types: Cigarettes    Last attempt to quit: 05/09/1987    Years since quitting: 30.7  . Smokeless tobacco: Former Systems developer    Types: Chew  Substance Use  Topics  . Alcohol use: No    Alcohol/week: 0.0 oz  . Drug use: No     Allergies   Codeine; Other; and Ropinirole   Review of Systems Review of Systems  Constitutional: Negative for fever.  Respiratory: Negative for cough and shortness of breath.   Cardiovascular: Negative for chest pain.  Gastrointestinal: Negative for abdominal pain, nausea and vomiting.  Genitourinary: Negative for dysuria and hematuria.  Musculoskeletal: Positive for back pain.  Neurological: Negative for headaches.     Physical Exam Updated Vital Signs BP (!) 150/77 (BP Location: Left Arm)   Pulse 68   Temp 97.9 F (36.6 C) (Oral)   Resp 16  SpO2 97%   Physical Exam  Constitutional: He is oriented to person, place, and time. He appears well-developed and well-nourished.  HENT:  Head: Normocephalic and atraumatic.  Mouth/Throat: Oropharynx is clear and moist and mucous membranes are normal.  Eyes: Conjunctivae, EOM and lids are normal. Pupils are equal, round, and reactive to light.  Neck: Full passive range of motion without pain.  Full flexion/extension and lateral movement of neck fully intact. No bony midline tenderness. No deformities or crepitus.   Cardiovascular: Normal rate, regular rhythm, normal heart sounds and normal pulses. Exam reveals no gallop and no friction rub.  No murmur heard. Pulses:      Radial pulses are 2+ on the right side, and 2+ on the left side.       Dorsalis pedis pulses are 2+ on the right side, and 2+ on the left side.  Pulmonary/Chest: Effort normal and breath sounds normal.  Abdominal: Soft. Normal appearance. There is no tenderness. There is no rigidity and no guarding.  Musculoskeletal: Normal range of motion.       Arms: Pain is reproduced with palpation of the left paraspinal muscles of the thoracic region.  Diffuse muscular tenderness overlying the left paraspinal muscles of the thoracic region.  No midline tenderness to both T or L-spine.  Neurological: He  is alert and oriented to person, place, and time.  Follows commands, Moves all extremities  5/5 strength to BUE and BLE  Sensation intact throughout all major nerve distributions  Skin: Skin is warm and dry. Capillary refill takes less than 2 seconds.  Psychiatric: He has a normal mood and affect. His speech is normal.  Nursing note and vitals reviewed.    ED Treatments / Results  Labs (all labs ordered are listed, but only abnormal results are displayed) Labs Reviewed - No data to display  EKG  EKG Interpretation None       Radiology Ct Renal Stone Study  Result Date: 01/12/2018 CLINICAL DATA:  Left flank pain for 1 week EXAM: CT ABDOMEN AND PELVIS WITHOUT CONTRAST TECHNIQUE: Multidetector CT imaging of the abdomen and pelvis was performed following the standard protocol without IV contrast. COMPARISON:  None. FINDINGS: Lower chest: No acute abnormality. Hepatobiliary: No focal hepatic abnormality. Gallbladder unremarkable. Pancreas: No focal abnormality or ductal dilatation. Spleen: No focal abnormality.  Normal size. Adrenals/Urinary Tract: No adrenal abnormality. No focal renal abnormality. No stones or hydronephrosis. Urinary bladder is unremarkable. Stomach/Bowel: Sigmoid and descending colonic diverticulosis. No active diverticulitis. Stomach and small bowel decompressed, unremarkable. Appendix not visualized, question prior appendectomy. Vascular/Lymphatic: Aortic atherosclerosis. No enlarged abdominal or pelvic lymph nodes. Reproductive: No visible focal abnormality. Other: No free fluid or free air. Musculoskeletal: No acute bony abnormality. Bilateral hip replacements. IMPRESSION: No renal or ureteral stones.  No hydronephrosis. Left colonic diverticulosis.  No active diverticulitis. Bilateral hip replacements. No acute findings in the abdomen or pelvis. Electronically Signed   By: Rolm Baptise M.D.   On: 01/12/2018 11:52    Procedures Procedures (including critical care  time)  Medications Ordered in ED Medications  HYDROmorphone (DILAUDID) injection 0.5 mg (0.5 mg Intramuscular Given 01/12/18 2100)  diazepam (VALIUM) injection 5 mg (5 mg Intramuscular Given 01/12/18 2158)  oxyCODONE-acetaminophen (PERCOCET/ROXICET) 5-325 MG per tablet 1 tablet (1 tablet Oral Given 01/12/18 2229)     Initial Impression / Assessment and Plan / ED Course  I have reviewed the triage vital signs and the nursing notes.  Pertinent labs & imaging results that were available during  my care of the patient were reviewed by me and considered in my medical decision making (see chart for details).     73 year old male who presents for evaluation of left-sided back pain that is been intermittent for the last 3 weeks.  Patient reports pain became more frequent.  No preceding trauma, injury, fall.  No fevers, night sweats, weight loss.  No history of back surgeries.  Has been able to ambulate. Patient is afebrile, non-toxic appearing, sitting comfortably on examination table. Vital signs reviewed and stable. No neuro deficits noted on exam.  Exam shows diffuse tenderness overlying the paraspinal muscles of the left thoracic region.  No midline no tenderness overlying the left ribs.  No deformities or crepitus noted.  5 out of 5 strength in both bilateral upper and lower extremities.  Suspect that this is musculoskeletal in nature.  History/physical exam not concerning for aortic dissection, PE.  Will give analgesics in the department.  Records reviewed.  Patient was seen earlier in the ED today.  At that time, he had a CT scan and UA.  CT was negative for any signs of kidney stone.  No evidence of hydronephrosis.  No other abnormalities noted.  UA was negative for any acute signs of infection or hemoglobin.  At this time do not feel that further workup is indicated.  Discussed patient with Dr. Zenia Resides who independently evaluated patient.  Suspect that symptoms are musculoskeletal in nature.  We will  plan to give analgesics here in the department.  Patient does report an allergy to codeine but I discussed with him taking Percocet.  He states that he has taken Percocet before no issues.  We will plan to give him Valium and Percocet here in the department.  We will also send him home with analgesics.  Patient instructed follow-up with his primary care doctor in the next 24-48 hours for further evaluation. Patient had ample opportunity for questions and discussion. All patient's questions were answered with full understanding. Strict return precautions discussed. Patient expresses understanding and agreement to plan.    Final Clinical Impressions(s) / ED Diagnoses   Final diagnoses:  Muscle spasm  Acute left-sided thoracic back pain    ED Discharge Orders        Ordered    diazepam (VALIUM) 2 MG tablet  2 times daily     01/12/18 2216    oxyCODONE-acetaminophen (PERCOCET/ROXICET) 5-325 MG tablet  Every 6 hours PRN     01/12/18 2216       Volanda Napoleon, PA-C 01/13/18 0033

## 2018-01-12 NOTE — ED Notes (Signed)
Bed: QR97 Expected date:  Expected time:  Means of arrival:  Comments: Tri 2

## 2018-01-12 NOTE — ED Notes (Signed)
ED Provider at bedside. 

## 2018-01-12 NOTE — ED Provider Notes (Signed)
[facility] DEPT Provider Note   CSN: [id number] Arrival date & time: 01/12/18  1047     History   Chief Complaint Chief Complaint  Patient presents with  . Flank Pain    HPI Johnathan Rodriguez is a 73 y.o. male.  HPI   73 year old male with flank/back pain.  He has had intermittent milder pain over the past week or so.  Pain will wax and wane.  Today the pain was significantly worse.  He states he feels like he has been stabbed in a side.  Associated nausea.  No urinary complaints.  No change in his bowel movements. No history of kidney stones.  Past Medical History:  Diagnosis Date  . Arthritis   . Asthma    NO PROBLEMS SINCE 2012  . [facility]    SKIN CANCER  . Constipation   . Coronary artery disease    CABG 2012  . Diabetes mellitus   . Elevated cholesterol   . GERD (gastroesophageal reflux disease)   . Hx of gastric ulcer   . Hypertension   . Myocardial infarction ([place]) 12/10/2010  . Neuropathy    FEET  . Nocturia   . Pneumonia    hx of   . PONV (postoperative nausea and vomiting)   . Prostate cancer ([place]) 06/10/15    Patient Active Problem List   Diagnosis Date Noted  . Prostate cancer ([place]) 07/25/2015  . Malignant neoplasm of prostate ([place]) 06/23/2015  . Urinary retention 03/11/2014  . Unspecified essential hypertension 03/09/2014  . Pure hypercholesterolemia 03/09/2014  . GERD (gastroesophageal reflux disease) 03/09/2014  . Volume overload postop  03/09/2014  . CAD (coronary artery disease) 03/04/2014  . Postop Hyponatremia 05/20/2012  . OA (osteoarthritis) of hip 05/19/2012    Past Surgical History:  Procedure Laterality Date  . APPENDECTOMY    . CORONARY ARTERY BYPASS GRAFT  12/13/10   4 VESSELS  DEC 2012  . EYE SURGERY    . KNEE ARTHROSCOPY     BIL- right knee arthroscopy x 2   . LYMPHADENECTOMY Bilateral 07/25/2015   Procedure: PELVIC LYMPHADENECTOMY;  Surgeon: Raynelle Bring, MD;  Location: [facility] ORS;   Service: Urology;  Laterality: Bilateral;  . ROBOT ASSISTED LAPAROSCOPIC RADICAL PROSTATECTOMY N/A 07/25/2015   Procedure: ROBOTIC ASSISTED LAPAROSCOPIC RADICAL PROSTATECTOMY LEVEL 2;  Surgeon: Raynelle Bring, MD;  Location: [facility];  Service: Urology;  Laterality: N/A;  . TOTAL HIP ARTHROPLASTY  05/19/2012   Procedure: TOTAL HIP ARTHROPLASTY;  Surgeon: [name], MD;  Location: [facility];  Service: Orthopedics;  Laterality: Right;  . TOTAL HIP ARTHROPLASTY Left 03/08/2014   Procedure: LEFT TOTAL HIP ARTHROPLASTY;  Surgeon: [name], MD;  Location: [facility];  Service: Orthopedics;  Laterality: Left;       Home Medications    Prior to Admission medications   Medication Sig Start Date End Date Taking? Authorizing Provider  HYDROcodone-acetaminophen (NORCO) 5-325 MG per tablet Take 1-2 tablets by mouth every 6 (six) hours as needed. 07/25/15   Debbrah Alar, PA-C  lansoprazole (PREVACID) 15 MG capsule Take 15 mg by mouth daily at 12 noon.    [provider]  lisinopril (PRINIVIL,ZESTRIL) 2.5 MG tablet Take 2.5 mg by mouth daily with breakfast.    [provider]  metFORMIN (GLUCOPHAGE) 500 MG tablet Take 500 mg by mouth 2 (two) times daily with a meal.    [provider]  metoprolol (LOPRESSOR) 50 MG tablet Take 25 mg by mouth 2 (  two) times daily. Takes 1/2 pill BID    [provider]  pravastatin (PRAVACHOL) 40 MG tablet Take 40 mg by mouth every evening.     [provider]  sulfamethoxazole-trimethoprim (BACTRIM DS,SEPTRA DS) 800-160 MG per tablet Take 1 tablet by mouth 2 (two) times daily. Start the day prior to foley removal appointment 07/25/15   Debbrah Alar, PA-C    Family History Family History  Problem Relation Age of Onset  . Diverticulosis Sister   . Congestive Heart Failure Mother   . Other Son        Cardiac Disorder  . Heart attack Father     Social History Social History   Tobacco Use  . Smoking status: Former Smoker     Packs/day: 1.00    Years: 6.00    Pack years: 6.00    Types: Cigarettes    Last attempt to quit: 05/09/1987    Years since quitting: 30.7  . Smokeless tobacco: Former Systems developer    Types: Chew  Substance Use Topics  . Alcohol use: No    Alcohol/week: 0.0 oz  . Drug use: No     Allergies   Codeine; Other; and Ropinirole   Review of Systems Review of Systems All systems reviewed and negative, other than as noted in HPI.   Physical Exam Updated Vital Signs BP 125/76   Pulse 63   Resp 16   SpO2 99%   Physical Exam  Constitutional: He appears well-developed and well-nourished. No distress.  HENT:  Head: Normocephalic and atraumatic.  Eyes: Conjunctivae are normal. Right eye exhibits no discharge. Left eye exhibits no discharge.  Neck: Neck supple.  Cardiovascular: Normal rate, regular rhythm and normal heart sounds. Exam reveals no gallop and no friction rub.  No murmur heard. Pulmonary/Chest: Effort normal and breath sounds normal. No respiratory distress.  Abdominal: Soft. He exhibits no distension. There is no tenderness.  Musculoskeletal: He exhibits no edema or tenderness.  Back normal to inspection.  Pain is not reproducible.  Normal strength in bilateral lower extremities.  Sensation intact light touch.  Neurological: He is alert.  Skin: Skin is warm and dry.  Psychiatric: He has a normal mood and affect. His behavior is normal. Thought content normal.  Nursing note and vitals reviewed.    ED Treatments / Results  Labs (all labs ordered are listed, but only abnormal results are displayed) Labs Reviewed  CBC WITH DIFFERENTIAL/PLATELET - Abnormal; Notable for the following components:      Result Value   WBC 12.6 (*)    Neutro Abs 8.9 (*)    Monocytes Absolute 1.3 (*)    All other components within normal limits  BASIC METABOLIC PANEL - Abnormal; Notable for the following components:   Sodium 133 (*)    Chloride 94 (*)    Glucose, Bld 184 (*)    BUN 24 (*)     All other components within normal limits  URINALYSIS, ROUTINE W REFLEX MICROSCOPIC - Abnormal; Notable for the following components:   Glucose, UA 150 (*)    All other components within normal limits    EKG  EKG Interpretation None       Radiology Ct Renal Stone Study  Result Date: 01/12/2018 CLINICAL DATA:  Left flank pain for 1 week EXAM: CT ABDOMEN AND PELVIS WITHOUT CONTRAST TECHNIQUE: Multidetector CT imaging of the abdomen and pelvis was performed following the standard protocol without IV contrast. COMPARISON:  None. FINDINGS: Lower chest: No acute abnormality. Hepatobiliary: No  focal hepatic abnormality. Gallbladder unremarkable. Pancreas: No focal abnormality or ductal dilatation. Spleen: No focal abnormality.  Normal size. Adrenals/Urinary Tract: No adrenal abnormality. No focal renal abnormality. No stones or hydronephrosis. Urinary bladder is unremarkable. Stomach/Bowel: Sigmoid and descending colonic diverticulosis. No active diverticulitis. Stomach and small bowel decompressed, unremarkable. Appendix not visualized, question prior appendectomy. Vascular/Lymphatic: Aortic atherosclerosis. No enlarged abdominal or pelvic lymph nodes. Reproductive: No visible focal abnormality. Other: No free fluid or free air. Musculoskeletal: No acute bony abnormality. Bilateral hip replacements. IMPRESSION: No renal or ureteral stones.  No hydronephrosis. Left colonic diverticulosis.  No active diverticulitis. Bilateral hip replacements. No acute findings in the abdomen or pelvis. Electronically Signed   By: Rolm Baptise M.D.   On: 01/12/2018 11:52    Procedures Procedures (including critical care time)  Medications Ordered in ED Medications  sodium chloride 0.9 % bolus 500 mL (not administered)  ketorolac (TORADOL) 15 MG/ML injection 15 mg (not administered)  HYDROmorphone (DILAUDID) injection 0.75 mg (not administered)     Initial Impression / Assessment and Plan / ED Course  I have  reviewed the triage vital signs and the nursing notes.  Pertinent labs & imaging results that were available during my care of the patient were reviewed by me and considered in my medical decision making (see chart for details).     73 year old male with left thoracic back pain.  He has no CVA tenderness.  Urinalysis is pretty unremarkable.  Nonfocal neuro exam.  CT the abdomen pelvis without ureteral stone or other explanatory pathology.  Musculoskeletal?.  Will treat symptomatically.  No overt red flags.  Final Clinical Impressions(s) / ED Diagnoses   Final diagnoses:  Acute left-sided thoracic back pain    ED Discharge Orders    None       Virgel Manifold, MD 01/12/18 1528

## 2018-01-12 NOTE — ED Notes (Signed)
RN will start IV and collect labs 

## 2018-01-12 NOTE — ED Triage Notes (Signed)
Flank pain for over a week, goes and comes. Now more in left side . No urinary difficulty

## 2018-01-12 NOTE — Discharge Instructions (Signed)
Take Valium as directed.  Take Percocet as directed.  You can apply heat to the affected area.  Follow-up with your primary care doctor in the next 24-48 hours for further evaluation.  Return to the Emergency Department immediately for any worsening back pain, neck pain, difficulty walking, numbness/weaknss of your arms or legs, chest pain, urinary or bowel accidents, fever or any other worsening or concerning symptoms.

## 2018-01-12 NOTE — ED Notes (Signed)
Pt is asking for pain medicine. RN attempted to tell a PA but no provider has currently signed up for the pt at this time.

## 2018-01-12 NOTE — ED Triage Notes (Signed)
Pt reports he was here earlier and was seen to r/o CT scan. Pt is asking for pain medicine. Pt reports that the shot of pain medicine they gave him earlier wore off by the time they got home.  Pt's wife states "They need to keep him until they find out what is going on."

## 2018-11-06 ENCOUNTER — Telehealth: Payer: Self-pay | Admitting: Cardiology

## 2018-11-12 NOTE — Telephone Encounter (Signed)
done

## 2019-01-01 ENCOUNTER — Ambulatory Visit: Payer: Medicare HMO | Admitting: Cardiology
